# Patient Record
Sex: Male | Born: 1980 | Race: Black or African American | Hispanic: No
Health system: Midwestern US, Community
[De-identification: ages and names within clinical notes are randomized; demographics above are authoritative.]

## PROBLEM LIST (undated history)

## (undated) DIAGNOSIS — Q742 Other congenital malformations of lower limb(s), including pelvic girdle: Secondary | ICD-10-CM

## (undated) HISTORY — PX: APPENDECTOMY: SHX54

## (undated) LAB — EKG 12-LEAD: ECG Report: NORMAL

---

## 2008-12-29 LAB — STREP SCREEN GROUP A THROAT: Rapid Strep A Screen: NEGATIVE

## 2009-06-28 LAB — STREP SCREEN GROUP A THROAT: Rapid Strep A Screen: NEGATIVE

## 2009-09-19 LAB — STREP SCREEN GROUP A THROAT: Rapid Strep A Screen: NEGATIVE

## 2009-10-06 NOTE — ED Notes (Signed)
Pt brought back to room 13. On cardiac monitor as charted. No distress noted.    Eden Emms, RN  10/06/09 2102

## 2009-10-06 NOTE — Discharge Instructions (Signed)
Acute Bronchitis     Bronchitis is a problem of the air tubes leading to your lungs. Acute means the illness started quickly. In this condition, the lining of those tubes becomes puffy (swollen) and can leak fluid. This makes it harder for air to get in and out of your lungs. You may cough a lot. This is because the air tubes are narrow. Bronchitis is most often caused by a virus. Medicines that kill germs (antibiotics) may be needed with germ (bacteria) infections for people who:   Smoke.   Have lasting (chronic) lung problems.   Are elderly.     HOME CARE      Rest.   Drink enough water and fluids to keep the pee clear or pale yellow.   Only take medicine as told by your doctor.     Medicines may be prescribed that will open up the airways. This will help make breathing easier.   Bronchitis usually gets better on its own in a few days.     Recovery from some problems (symptoms) of bronchitis may be slow. You should start feeling a little better after 2 to 3 days. Coughing may last for 3 to 4 weeks.     GET HELP RIGHT AWAY IF:      You or your child has a temperature by mouth above 102 F (38.9 C), not controlled by medicine.   Chills or chest pain develops.   You or your child develops very bad shortness of breath.   There is bloody saliva mixed with mucus (sputum).   You or your child throws up (vomits) often, loses too much fluid (dehydration), feels faint, or has a very bad headache.   You or your child does not improve after 1 week of treatment.       MAKE SURE YOU:        Understand these instructions.     Will watch this condition.   Will get help right away if you or your child is not doing well or gets worse.     Document Released: 04/19/2009    [employer] Patient Information 2011 [employer].

## 2009-10-06 NOTE — ED Provider Notes (Signed)
HPI  10/06/2009, Time: 9:03 PM.       Todd Dean is a 29 y.o. male presenting to the ED for SOB, beginning yesterday.  The complaint has been constant, mild in severity, and worsened by nothing. Pt also complaints of productive cough. Pt states he has chest tightness from his cough. He says symptoms feel like when he had asthma when he was younger.  He was in the ER 2 weeks ago and diagnosed with bronchitis. Pt smokes a cigar a day.    Review of Systems   Constitutional: Negative for fever and chills.   Respiratory: Positive for cough, chest tightness (with cough) and shortness of breath.    Cardiovascular: Negative for chest pain.   Gastrointestinal: Negative for nausea, vomiting, abdominal pain and diarrhea.   Genitourinary: Negative for dysuria.   Skin: Negative for wound.   All other systems reviewed and are negative.        Physical Exam  Constitutional: He appears well-developed and well-nourished.  No acute distress.  Head: Normocephalic and atraumatic.   Eyes: Conjunctivae are normal. PERRL.  HENT: Mucous membranes are moist.  Neck: Supple.   Cardiovascular: Regular rate.  Regular rhythm.  Heart sounds normal.  Distal pulses intact.   Pulmonary/Chest: No respiratory distress. Expiratory wheezing.  Abdominal: Soft. There is no tenderness. No guarding or rebound.  Musculoskeletal: No edema.   Skin: Warm and dry.   Neurological: He is alert and oriented.        Procedures    MDM  --------------------------------------------- PAST HISTORY ---------------------------------------------  Past Medical History: has a past medical history of Asthma.    Past Surgical History: has no past surgical history on file.    Social History:  reports that he has been smoking cigars.  He does not have any smokeless tobacco history on file.  He reports that he does not currently drink alcohol or use illicit drugs.    Family History: family history is not on file.     The patient???s home medications have been  reviewed.    Allergies: Review of patient's allergies indicates no known allergies.    -------------------------------------------------- RESULTS -------------------------------------------------    LABS:  Results for orders placed during the hospital encounter of 10/06/09   CARDIAC ED PROFILE       Component Value Range    Sodium 141  132 - 146 (mmol/L)    Potassium 3.5  3.5 - 5.5 (mmol/L)    Chloride 103  99 - 109 (mmol/L)    CO2 26  20 - 31 (mmol/L)    Glucose 95  70 - 110 (mg/dL)    Bun 18  6 - 20 (mg/dL)    Creatinine 1.0  0.7 - 1.3 (mg/dL)    Calcium 9.3  8.6 - 10.5 (mg/dL)    Hemolysis NONE      WBC 8.9  4.5 - 11.5 (E9/L)    RBC 5.65  3.80 - 5.80 (E12/L)    HGB 14.3  12.5 - 16.5 (g/dL)    HCT 13.2  44.0 - 10.2 (%)    MCV 76.4 (*) 80.0 - 99.9 (fL)    MCH 25.4 (*) 26.0 - 35.0 (pg)    MCHC 33.2  32.0 - 34.5 (%)    RDW 14.4  11.5 - 15.0 (fL)    Platelets 167  130 - 450 (E9/L)    MPV 8.9  7.0 - 12.0 (fL)    Total CK 206  32 - 294 (U/L)    CK-MB  0.4  0.0 - 5.0 (ng/mL)    Troponin I <0.01  0.00 - 0.40 (ng/mL)   D-DIMER, QUANTITATIVE       Component Value Range    D-Dimer, Quant <250     GFR CALCULATED       Component Value Range    Gfr Calculated >60  >=60 (ml/mn/1.73)       RADIOLOGY:  Interpreted by Radiologist.  XR CHEST PA AND LATERAL    (Results Pending)       EKG:  This EKG is signed and interpreted by the EP.    Time: 9:05 PM  Rate: 71  Rhythm: Sinus  Interpretation: no acute changes  Comparison: None    ------------------------- NURSING NOTES AND VITALS REVIEWED ---------------------------   The nursing notes within the ED encounter and vital signs as below have been reviewed.   BP 134/77   Pulse 73   Temp(Src) 98.4 ??F (36.9 ??C) (Oral)   Resp 16   Ht 5\' 11"  (1.803 m)   Wt 185 lb (83.915 kg)   BMI 25.80 kg/m2  Oxygen Saturation Interpretation: Normal at 100%    The patient???s available past medical records and past encounters were reviewed.      ------------------------------------------ PROGRESS NOTES  ------------------------------------------         Re-Evaluations:   2200: Patient feeling better after duonebs.    Counseling:   The emergency provider has spoken with the patient and discussed today???s results, in addition to providing specific details for the plan of care and counseling regarding the diagnosis and prognosis.  Questions are answered at this time and they are agreeable with the plan.      --------------------------------- ADDITIONAL PROVIDER NOTES ---------------------------------         This patient's ED course included: a personal history and physicial eaxmination, re-evaluation prior to disposition and cardiac monitoring    This patient has remained hemodynamically stable during their ED course. SpO2 100% on room air since he has been in the ED.       --------------------------------- IMPRESSION AND DISPOSITION ---------------------------------    IMPRESSION  1. Bronchitis, not specified as acute or chronic    2. Bronchospasm        DISPOSITION  Disposition: Discharge to home  Patient condition is good    SCRIBE ATTESTATION  10/06/2009, 10:41 PM.    This note is prepared by Fransico Michael, acting as Scribe for [employer], DO.  Trevone Prestwood Conkle-Lagroux, DO:  The scribe's documentation has been prepared under my direction and personally reviewed by me in its entirety.  I confirm that the note above accurately reflects all work, treatment, procedures, and medical decision making performed by me.       Jazzmyne Rasnick Conkle-Lagroux, DO  10/06/09 2242

## 2009-10-07 ENCOUNTER — Inpatient Hospital Stay
Admit: 2009-10-07 | Discharge: 2009-10-07 | Disposition: A | Discharge status: Home / Self Care | Attending: Emergency Medicine

## 2009-10-07 LAB — CARDIAC ED PROFILE
BUN: 18 mg/dL (ref 6–20)
CK-MB: 0.4 ng/mL (ref 0.0–5.0)
CO2: 26 mmol/L (ref 20–31)
Calcium: 9.3 mg/dL (ref 8.6–10.5)
Chloride: 103 mmol/L (ref 99–109)
Creatinine: 1 mg/dL (ref 0.7–1.3)
Glucose: 95 mg/dL (ref 70–110)
HCT: 43.1 % (ref 37.0–54.0)
HGB: 14.3 g/dL (ref 12.5–16.5)
MCH: 25.4 pg — ABNORMAL LOW (ref 26.0–35.0)
MCHC: 33.2 % (ref 32.0–34.5)
MCV: 76.4 fL — ABNORMAL LOW (ref 80.0–99.9)
MPV: 8.9 fL (ref 7.0–12.0)
Platelets: 167 E9/L (ref 130–450)
Potassium: 3.5 mmol/L (ref 3.5–5.5)
RBC: 5.65 E12/L (ref 3.80–5.80)
RDW: 14.4 fL (ref 11.5–15.0)
Sodium: 141 mmol/L (ref 132–146)
Total CK: 206 U/L (ref 32–294)
Troponin I: <0.01 ng/mL (ref 0.00–0.40)
WBC: 8.9 E9/L (ref 4.5–11.5)

## 2009-10-07 LAB — D-DIMER, QUANTITATIVE: D-Dimer, Quant: <250 ng/mL

## 2009-10-07 LAB — GFR CALCULATED: Gfr Calculated: >60 mL/min/{1.73_m2} (ref >=60)

## 2009-10-07 MED ADMIN — ipratropium-albuterol (DUONEB) nebulizer solution 1 ampule: 1 | RESPIRATORY_TRACT | @ 01:00:00 | NDC 00487020101

## 2009-10-07 MED FILL — IPRATROPIUM-ALBUTEROL 0.5-2.5 (3) MG/3ML IN SOLN: RESPIRATORY_TRACT | Qty: 3

## 2009-11-06 ENCOUNTER — Inpatient Hospital Stay
Admit: 2009-11-06 | Discharge: 2009-11-06 | Disposition: A | Discharge status: Home / Self Care | Attending: Emergency Medicine

## 2009-11-06 NOTE — Discharge Instructions (Signed)
Dental Caries      Dental caries (tooth decay) is the most common of all oral diseases. It is particularly important to deal with it from your child's 1st to 12th year of life. In these years, the baby teeth erupt and are replaced by the permanent teeth. The permanent teeth, excluding the 3rd molars, erupt (come out) into their working position.     For early treatment of dental caries, your child should get their first dental checkup between one and one-half and two years of age. This is important before any extensive cavities are established.     Dental caries often appears as a white chalky area on the enamel. It later softens, and then the tooth structure breaks down. If not treated right away, it progresses towards the pulp. It will then require more extensive treatment to save the tooth.     PREVENTION   In children, dentistry is mainly used for the prevention of dental cavities.   Sealants can help with prevention of cavities. Sealants are composite resins applied onto surfaces of teeth at risk for decay. They smooth out the fissures and pits, and prevent food entrapment that causes decay.   Fluoride tablets may also be prescribed in the children over 61 years of age, if your drinking water is not fluoridated. The fluoride absorbed by the tooth enamel makes them less susceptible to caries.   Thorough daily cleaning with a toothbrush and dental floss is the best way to prevent cavities.   Regular visits with a dentist for check-ups and cleanings is also very important.     SEEK IMMEDIATE MEDICAL ATTENTION IF:   You develop a fever over 102 F (38 C).   You develop redness and swelling of your face, jaw, or neck.   You are unable to open your mouth.    You have severe pain uncontrolled by pain medicine.     Document Released: 10/15/2001  Document Re-Released: 04/19/2009  [facility] Patient Information 2011 [place].

## 2009-11-06 NOTE — ED Notes (Signed)
C/o r lower dental pain, no facial swelling noted    Marcelline Mates, RN  11/06/09 [phone]

## 2009-11-06 NOTE — ED Provider Notes (Addendum)
Patient is a 29 y.o. male presenting with tooth pain. The history is provided by the patient.   Dental PainThe primary symptoms include mouth pain and dental injury. Primary symptoms do not include oral bleeding, oral lesions, headaches, fever, shortness of breath, sore throat, angioedema or cough. The symptoms began 5 to 7 days ago. The symptoms are worsening. The symptoms occur constantly.   Additional symptoms include: dental sensitivity to temperature and gum tenderness. Additional symptoms do not include: gum swelling, purulent gums, trismus, jaw pain, facial swelling, trouble swallowing, pain with swallowing, excessive salivation, dry mouth, taste disturbance, smell disturbance, drooling, ear pain, hearing loss, nosebleeds, swollen glands, goiter and fatigue. Medical issues do not include: alcohol problem, smoking, chewing tobacco, immunosuppression, periodontal disease and cancer.       Review of Systems   Constitutional: Negative for fever, chills and fatigue.   HENT: Negative for hearing loss, ear pain, nosebleeds, sore throat, facial swelling, drooling, trouble swallowing and sinus pressure.    Eyes: Negative for pain, discharge and redness.   Respiratory: Negative for cough, shortness of breath and wheezing.    Cardiovascular: Negative for chest pain.   Gastrointestinal: Negative for nausea, vomiting, abdominal pain and diarrhea.   Genitourinary: Negative for dysuria and frequency.   Musculoskeletal: Negative for back pain and arthralgias.   Skin: Negative for rash and wound.   Neurological: Negative for weakness and headaches.   Hematological: Negative for adenopathy.   All other systems reviewed and are negative.        Physical Exam   Nursing note and vitals reviewed.  Constitutional: He is oriented to person, place, and time. He appears well-developed and well-nourished.   HENT:   Head: Normocephalic and atraumatic.   Mouth/Throat:       Eyes: Pupils are equal, round, and reactive to light.   Neck:  Normal range of motion. Neck supple.   Cardiovascular: Normal rate, regular rhythm and normal heart sounds.    No murmur heard.  Pulmonary/Chest: Effort normal and breath sounds normal.   Abdominal: Soft. Bowel sounds are normal. No tenderness. He has no rebound and no guarding.   Musculoskeletal: He exhibits no edema.   Neurological: He is alert and oriented to person, place, and time.   Skin: Skin is warm and dry.       Procedures    MDM    Labs      Radiology       EKG Interpretation      Ruthell Rummage, MD  11/06/09 [phone]    --------------------------------------------- PAST HISTORY ---------------------------------------------  Past Medical History: has a past medical history of Asthma.    Past Surgical History: has no past surgical history on file.    Social History:  reports that he has been smoking cigars.  He does not have any smokeless tobacco history on file.  He reports that he does not currently drink alcohol or use illicit drugs.    Family History: family history is not on file.     The patient???s home medications have been reviewed.    Allergies: Review of patient's allergies indicates no known allergies.    -------------------------------------------------- RESULTS -------------------------------------------------  Results for orders placed during the hospital encounter of 10/06/09   EKG 12-LEAD       Component Value Range    ECG Report Sinus rhythm.Normal ECGNO PREVIOUS TRACING      PHYSICIAN Physician Interpreter:  Janett Billow Conkle-LaGroux, DO      ECG Heart Rate ECG HEART RATE: 71 /  min      ECG RR Interval ECG RR INTERVAL: 845 ms      ECG P Duration ECG P DURATION: 110 ms      ECG QRS Duration ECG QRS DURATION: 84 ms      P-R Interval ECG PR INTERVAL: 136 ms      Q-T Interval ECG QT INTERVAL: 362 ms      ECG QTC Interval ECG QTC INTERVAL: 393 ms      ECG QT Dispersion ECG QT DISPERSION: 28 ms      P Axis ECG P AXIS: 48 deg      ECG QRS Axis ECG QRS AXIS: 36 deg      T Axis ECG T AXIS: 11 deg        CARDIAC ED PROFILE       Component Value Range    Sodium 141  132 - 146 (mmol/L)    Potassium 3.5  3.5 - 5.5 (mmol/L)    Chloride 103  99 - 109 (mmol/L)    CO2 26  20 - 31 (mmol/L)    Glucose 95  70 - 110 (mg/dL)    Bun 18  6 - 20 (mg/dL)    Creatinine 1.0  0.7 - 1.3 (mg/dL)    Calcium 9.3  8.6 - 10.5 (mg/dL)    Hemolysis NONE      WBC 8.9  4.5 - 11.5 (E9/L)    RBC 5.65  3.80 - 5.80 (E12/L)    HGB 14.3  12.5 - 16.5 (g/dL)    HCT 43.1  37.0 - 54.0 (%)    MCV 76.4 (*) 80.0 - 99.9 (fL)    MCH 25.4 (*) 26.0 - 35.0 (pg)    MCHC 33.2  32.0 - 34.5 (%)    RDW 14.4  11.5 - 15.0 (fL)    Platelets 167  130 - 450 (E9/L)    MPV 8.9  7.0 - 12.0 (fL)    Total CK 206  32 - 294 (U/L)    CK-MB 0.4  0.0 - 5.0 (ng/mL)    Troponin I <0.01  0.00 - 0.40 (ng/mL)   D-DIMER, QUANTITATIVE       Component Value Range    D-Dimer, Quant <250     GFR CALCULATED       Component Value Range    Gfr Calculated >60  >=60 (ml/mn/1.73)          ------------------------- NURSING NOTES AND VITALS REVIEWED ---------------------------   The nursing notes within the ED encounter and vital signs as below have been reviewed.   BP 144/74   Pulse 80   Temp(Src) 97.9 ??F (36.6 ??C) (Oral)   Resp 14   Ht 5' 11"$  (1.803 m)   Wt 185 lb (83.915 kg)   BMI 25.80 kg/m2  Oxygen Saturation Interpretation: Normal      ------------------------------------------ PROGRESS NOTES ------------------------------------------   I have spoken with the patient and discussed today???s results, in addition to providing specific details for the plan of care and counseling regarding the diagnosis and prognosis.  Their questions are answered at this time and they are agreeable with the plan.  --------------------------------------------- PAST HISTORY ---------------------------------------------  Past Medical History: has a past medical history of Asthma.    Past Surgical History: has no past surgical history on file.    Social History:  reports that he has been smoking cigars.  He does not  have any smokeless tobacco history on file.  He reports that he does not  currently drink alcohol or use illicit drugs.    Family History: family history is not on file.     The patient???s home medications have been reviewed.    Allergies: Review of patient's allergies indicates no known allergies.    -------------------------------------------------- RESULTS -------------------------------------------------  Results for orders placed during the hospital encounter of 10/06/09   EKG 12-LEAD       Component Value Range    ECG Report Sinus rhythm.Normal ECGNO PREVIOUS TRACING      PHYSICIAN Physician Interpreter:  Janett Billow Conkle-LaGroux, DO      ECG Heart Rate ECG HEART RATE: 71 /min      ECG RR Interval ECG RR INTERVAL: 845 ms      ECG P Duration ECG P DURATION: 110 ms      ECG QRS Duration ECG QRS DURATION: 84 ms      P-R Interval ECG PR INTERVAL: 136 ms      Q-T Interval ECG QT INTERVAL: 362 ms      ECG QTC Interval ECG QTC INTERVAL: 393 ms      ECG QT Dispersion ECG QT DISPERSION: 28 ms      P Axis ECG P AXIS: 48 deg      ECG QRS Axis ECG QRS AXIS: 36 deg      T Axis ECG T AXIS: 11 deg     CARDIAC ED PROFILE       Component Value Range    Sodium 141  132 - 146 (mmol/L)    Potassium 3.5  3.5 - 5.5 (mmol/L)    Chloride 103  99 - 109 (mmol/L)    CO2 26  20 - 31 (mmol/L)    Glucose 95  70 - 110 (mg/dL)    Bun 18  6 - 20 (mg/dL)    Creatinine 1.0  0.7 - 1.3 (mg/dL)    Calcium 9.3  8.6 - 10.5 (mg/dL)    Hemolysis NONE      WBC 8.9  4.5 - 11.5 (E9/L)    RBC 5.65  3.80 - 5.80 (E12/L)    HGB 14.3  12.5 - 16.5 (g/dL)    HCT 43.1  37.0 - 54.0 (%)    MCV 76.4 (*) 80.0 - 99.9 (fL)    MCH 25.4 (*) 26.0 - 35.0 (pg)    MCHC 33.2  32.0 - 34.5 (%)    RDW 14.4  11.5 - 15.0 (fL)    Platelets 167  130 - 450 (E9/L)    MPV 8.9  7.0 - 12.0 (fL)    Total CK 206  32 - 294 (U/L)    CK-MB 0.4  0.0 - 5.0 (ng/mL)    Troponin I <0.01  0.00 - 0.40 (ng/mL)   D-DIMER, QUANTITATIVE       Component Value Range    D-Dimer, Quant <250     GFR CALCULATED         Component Value Range    Gfr Calculated >60  >=60 (ml/mn/1.73)          ------------------------- NURSING NOTES AND VITALS REVIEWED ---------------------------   The nursing notes within the ED encounter and vital signs as below have been reviewed.   BP 144/74   Pulse 80   Temp(Src) 97.9 ??F (36.6 ??C) (Oral)   Resp 14   Ht 5' 11"$  (1.803 m)   Wt 185 lb (83.915 kg)   BMI 25.80 kg/m2  Oxygen Saturation Interpretation: Normal      ------------------------------------------ PROGRESS NOTES ------------------------------------------  I have spoken with the patient and discussed today???s results, in addition to providing specific details for the plan of care and counseling regarding the diagnosis and prognosis.  Their questions are answered at this time and they are agreeable with the plan.      --------------------------------- ADDITIONAL PROVIDER NOTES ---------------------------------          This patient is stable for discharge.  I have shared the specific conditions for return, as well as the importance of follow-up.        --------------------------------- ADDITIONAL PROVIDER NOTES ---------------------------------          This patient is stable for discharge.  I have shared the specific conditions for return, as well as the importance of follow-up.  P    Ruthell Rummage, MD  11/06/09 [phone]

## 2010-02-05 ENCOUNTER — Inpatient Hospital Stay: Admit: 2010-02-05 | Disposition: A | Discharge status: Home / Self Care | Source: Home / Self Care | Admitting: Surgery

## 2010-02-05 LAB — GFR CALCULATED: Gfr Calculated: >60 mL/min/{1.73_m2} (ref >=60)

## 2010-02-05 LAB — CBC WITH AUTO DIFFERENTIAL
Absolute Baso #: 0.06 E9/L (ref 0.00–0.20)
Absolute Eos #: 0.06 E9/L (ref 0.05–0.50)
Absolute Lymph #: 2.27 E9/L (ref 1.50–4.00)
Absolute Mono #: 0.83 E9/L (ref 0.10–0.95)
Absolute Neut #: 10.05 E9/L — ABNORMAL HIGH (ref 1.80–7.30)
Basophils: 1 % (ref 0–2)
Eosinophils %: 0 % (ref 0–6)
HCT: 43.1 % (ref 37.0–54.0)
HGB: 14.1 g/dL (ref 12.5–16.5)
Lymphocytes: 17 % — ABNORMAL LOW (ref 20–42)
MCH: 25.5 pg — ABNORMAL LOW (ref 26.0–35.0)
MCHC: 32.8 % (ref 32.0–34.5)
MCV: 77.9 fL — ABNORMAL LOW (ref 80.0–99.9)
MPV: 9 fL (ref 7.0–12.0)
Monocytes: 6 % (ref 2–12)
Platelets: 155 E9/L (ref 130–450)
RBC: 5.54 E12/L (ref 3.80–5.80)
RDW: 15.4 fL — ABNORMAL HIGH (ref 11.5–15.0)
Seg Neutrophils: 76 % (ref 43–80)
WBC: 13.3 E9/L — ABNORMAL HIGH (ref 4.5–11.5)

## 2010-02-05 LAB — AMYLASE: Amylase: 80 U/L (ref 30–118)

## 2010-02-05 LAB — COMPREHENSIVE METABOLIC PANEL
ALT: 26 U/L (ref 10–49)
AST: 29 U/L (ref 0–33)
Albumin: 4.8 g/dL (ref 3.2–4.8)
Alkaline Phosphatase: 64 U/L (ref 45–129)
BUN: 25 mg/dL — ABNORMAL HIGH (ref 6–20)
Bilirubin, Total: 0.2 mg/dL — ABNORMAL LOW (ref 0.3–1.2)
CO2: 29 mmol/L (ref 20–31)
Calcium: 9.6 mg/dL (ref 8.6–10.5)
Chloride: 103 mmol/L (ref 99–109)
Creatinine: 1 mg/dL (ref 0.7–1.3)
Glucose: 95 mg/dL (ref 70–110)
Potassium: 4 mmol/L (ref 3.5–5.5)
Sodium: 141 mmol/L (ref 132–146)
Total Protein: 7.5 g/dL (ref 5.7–8.2)

## 2010-02-05 LAB — APTT: PTT: 26.8 secs. (ref 23.3–32.4)

## 2010-02-05 LAB — PROTIME-INR
INR: 0.9
Protime: 10.9 secs. (ref 9.8–12.5)

## 2010-02-05 LAB — LIPASE: Lipase: 21 U/L (ref 6–51)

## 2010-02-05 LAB — ANISOCYTOSIS

## 2010-02-05 MED ORDER — IOVERSOL 68 % IV SOLN
68 % | Freq: Once | INTRAVENOUS | Status: AC
Start: 2010-02-05 — End: 2010-02-05
  Administered 2010-02-05: 16:00:00 via INTRAVENOUS

## 2010-02-05 MED ORDER — ONDANSETRON HCL 4 MG/2ML IJ SOLN
4 MG/2ML | Freq: Once | INTRAMUSCULAR | Status: AC
Start: 2010-02-05 — End: 2010-02-05
  Administered 2010-02-05: 15:00:00 4 mg via INTRAVENOUS

## 2010-02-05 MED ORDER — SODIUM CHLORIDE 0.9 % IV BOLUS
0.9 % | Freq: Once | INTRAVENOUS | Status: AC
Start: 2010-02-05 — End: 2010-02-05
  Administered 2010-02-05: 15:00:00 1000 mL via INTRAVENOUS

## 2010-02-05 MED ORDER — ERTAPENEM SODIUM 1 G IJ SOLR
1 GM | Freq: Once | INTRAMUSCULAR | Status: AC
Start: 2010-02-05 — End: 2010-02-05
  Administered 2010-02-05: 17:00:00 via INTRAVENOUS

## 2010-02-05 MED ORDER — KETOROLAC TROMETHAMINE 30 MG/ML IJ SOLN
30 MG/ML | Freq: Once | INTRAMUSCULAR | Status: AC
Start: 2010-02-05 — End: 2010-02-05
  Administered 2010-02-05: 15:00:00 30 mg via INTRAVENOUS

## 2010-02-05 MED ORDER — PNEUMOCOCCAL 23-POLYVALENT VACC INJ
25 | Freq: Once | INTRAMUSCULAR | Status: DC
Start: 2010-02-05 — End: 2010-02-06

## 2010-02-05 MED ADMIN — dextrose 5 % and 0.45 % NaCl with KCl 20 mEq infusion: INTRAVENOUS | @ 20:00:00 | NDC 00338067104

## 2010-02-05 MED ADMIN — hydrocodone-acetaminophen (VICODIN) 5-500 MG per tablet 2 tablet: 2 | ORAL | @ 22:00:00 | NDC 00406035762

## 2010-02-05 MED FILL — SODIUM CHLORIDE 0.9 % IV SOLN: 0.9 % | INTRAVENOUS | Qty: 1000

## 2010-02-05 MED FILL — ONDANSETRON HCL 4 MG/2ML IJ SOLN: 4 MG/2ML | INTRAMUSCULAR | Qty: 2

## 2010-02-05 MED FILL — KCL IN DEXTROSE-NACL 20-5-0.45 MEQ/L-%-% IV SOLN: INTRAVENOUS | Qty: 1000

## 2010-02-05 MED FILL — KETOROLAC TROMETHAMINE 30 MG/ML IJ SOLN: 30 MG/ML | INTRAMUSCULAR | Qty: 1

## 2010-02-05 MED FILL — HYDROCODONE-ACETAMINOPHEN 5-500 MG PO TABS: 5-500 MG | ORAL | Qty: 2

## 2010-02-05 MED FILL — INVANZ 1 G IJ SOLR: 1 g | INTRAMUSCULAR | Qty: 1000

## 2010-02-05 MED FILL — BUPIVACAINE HCL (PF) 0.25 % IJ SOLN: 0.25 % | INTRAMUSCULAR | Qty: 10

## 2010-02-05 NOTE — ED Notes (Signed)
Patient resting in bed.  Pain reported as being better - discomfort present.  Nausea resolved.  V/s/s.  Continue to monitor.    Milford Cage Teyton Pattillo, RN  02/05/10 [phone]

## 2010-02-05 NOTE — ED Notes (Signed)
Surgical consent signed.  V/s/s.  Patient resting in bed.  Continue to monitor.    Milford Cage Kathyjo Briere, RN  02/05/10 [phone]

## 2010-02-05 NOTE — Op Note (Signed)
Todd Dean, Todd Dean                                   [id number]      DATE OF PROCEDURE:  02/05/2010      SURGEON:  Willadean Carol, M.D.      ASSISTANT:      PREOPERATIVE DIAGNOSIS:  Acute appendicitis.      POSTOPERATIVE DIAGNOSIS:  Acute appendicitis.      OPERATION:  Laparoscopic appendectomy.      ANESTHESIA:  General.      ESTIMATED BLOOD LOSS:  Minimal.      COMPLICATIONS:  None.      FLUIDS:  Per Anesthesia.      SPECIMEN:  Appendix.      DISPOSITION:  The patient was stable when sent to recovery with plans for   discharge home in the morning, after a _____ -hour observation admission.      HISTORY AND INDICATIONS FOR PROCEDURE:  This is a 29 year old male with acute   onset of abdominal pain, worse in the right lower quadrant.  The exam was   consistent with appendicitis.  CT findings suggested an appendicolith and   acute appendicitis.  He was explained the risks, benefits, alternatives,   expected outcomes and was willing to proceed with the surgery.      DETAILS OF THE PROCEDURE:  With the patient in the supine position, after   induction of anesthesia, a Foley catheter was placed.  The skin was prepped   and draped in an aseptic fashion.      Towel clamps were applied to the infraumbilical plane.  A 5-mm incision was   made.  A Veress needle was placed into the peritoneal cavity and a saline   flush was used to confirm placement.  The opening pressure on entering the   abdomen was 2 mmHg.  The intraabdominal pressure was then raised to 15.   Following this, a 5-mm retractable bladed trocar was placed into the   peritoneal cavity.  A laparoscope was inserted.  There was no purulent fluid.   However, there was some inflammation noted in the right lower quadrant.  In   order to see, I placed the patient in a steep Trendelenburg position, and   placed a 5-mm port site in the suprapubic region above the bladder cuff.   Using some gentle retraction, I identified the appendix.  It was retrocecal,   and it was  thickened and inflamed.  At this point, a 12-mm left lower   quadrant port was placed to triangulate the appendix.  It was grasped and   retracted upward with a Babcock clamp.  Using sharp scissors, I dissected it   free from the retroperitoneum, removing any loose adhesions, and exposing the   entire appendix.  I created a window at the base of the appendix near its   junction to the cecum with a Art gallery manager.  I passed an endoscopic GIA   stapler with a blue load and transected the appendix from the base of the   cecum.  Following this, the LigaSure was used to secure the mesoappendix   along its entirety.  The appendix was placed into an EndoPouch and pulled   through the 12-mm port site.  Next, the right lower quadrant was irrigated   with saline and suctioned dry until the return of fluid was clear.  The rest  of the abdominal cavity was inspected with no acute intraabdominal   pathology.      All needle, sponge, and instrument counts were correct.  All ports were   removed under direct vision with the laparoscope.  The pneumoperitoneum was   completely evacuated.  The 12-mm port site fascial defect was reapproximated   with a stitch of #0 Vicryl.  The skin incisions were closed with 4-0   Monocryl, followed by Steri-Strips.      The patient tolerated the procedure well.  He was transferred to recovery in   stable condition.      The plan is for routine postoperative care and discharge in the morning.      Dictated by:  Willadean Carol, M.D.            Todd Dean   DD: 02/05/2010    1:59 P    DT: 02/05/2010  2:16 P   [id number]    [id number]   CC:  MICHAEL J. Margarito Liner., M.D.        Willadean Carol, M.D.

## 2010-02-05 NOTE — Brief Op Note (Signed)
Brief Postoperative Note    Todd Dean  Date of Birth:  February 03, 1981  [id number]    Pre-operative Diagnosis: acute appendicitis    Post-operative Diagnosis: Same    Procedure: laparoscopic appendectomy    Anesthesia: [facility]    Surgeons/Assistants: Elyse Jarvis    Estimated Blood Loss: Minimal    Complications: none    Specimens: were obtained      Todd Dean

## 2010-02-05 NOTE — ED Provider Notes (Signed)
HPI  02/05/2010, Time: 0925.       Todd Dean is a 29 y.o. male presenting to the ED for abdominal pain, beginning 7 hours ago.  The complaint has been constant, moderate in severity, and worsened by nothing.  Pt states that he has been having right sided abdominal pain, nausea, vomiting and diarrhea.     Review of Systems   Constitutional: Negative for fever, chills and diaphoresis.   HENT: Negative for neck pain and neck stiffness.    Respiratory: Negative for cough and shortness of breath.    Cardiovascular: Negative for chest pain.   Gastrointestinal: Positive for nausea, vomiting, abdominal pain and diarrhea.   Genitourinary: Negative.    Musculoskeletal: Negative.    Skin: Negative.    Neurological: Negative for weakness, numbness and headaches.   All other systems reviewed and are negative.        Physical Exam  Constitutional: He appears well-developed and well-nourished.  No acute distress.  Head: Normocephalic and atraumatic.   Eyes: Conjunctivae are normal. PERRL.  HENT: Mucous membranes are moist.  Neck: Supple.   Cardiovascular: Regular rate.  Regular rhythm.  Heart sounds normal.  Distal pulses intact.  Pulmonary/Chest: No respiratory distress. Breath sounds normal.  Abdominal: Soft. There is diffuse tenderness. No guarding or rebound.  Musculoskeletal: No edema.   Skin: Warm and dry.   Neurological: He is alert and oriented.      Procedures    MDM    --------------------------------------------- PAST HISTORY ---------------------------------------------  Past Medical History: has a past medical history of Asthma.    Past Surgical History: has no past surgical history on file.    Social History:  reports that he has been smoking cigars.  He does not have any smokeless tobacco history on file.  He reports that he drinks alcohol.  He reports that he does not currently use illicit drugs.    Family History: family history is not on file.     The patient???s home medications have been  reviewed.    Allergies: Review of patient's allergies indicates no known allergies.    -------------------------------------------------- RESULTS -------------------------------------------------    LABS:  Results for orders placed during the hospital encounter of 02/05/10   CBC WITH AUTO DIFFERENTIAL       Component Value Range    WBC 13.3 (*) 4.5 - 11.5 (E9/L)    RBC 5.54  3.80 - 5.80 (E12/L)    HGB 14.1  12.5 - 16.5 (g/dL)    HCT 16.1  09.6 - 04.5 (%)    MCV 77.9 (*) 80.0 - 99.9 (fL)    MCH 25.5 (*) 26.0 - 35.0 (pg)    MCHC 32.8  32.0 - 34.5 (%)    RDW 15.4 (*) 11.5 - 15.0 (fL)    Platelets 155  130 - 450 (E9/L)    MPV 9.0  7.0 - 12.0 (fL)    Absolute Neut # 10.05 (*) 1.80 - 7.30 (E9/L)    Absolute Lymph # 2.27  1.50 - 4.00 (E9/L)    Absolute Mono # 0.83  0.10 - 0.95 (E9/L)    Absolute Eos # 0.06  0.05 - 0.50 (E9/L)    Absolute Baso # 0.06  0.00 - 0.20 (E9/L)    Seg Neutrophils 76  43 - 80 (%)    Lymphocytes 17 (*) 20 - 42 (%)    Monocytes 6  2 - 12 (%)    Eosinophils 0  0 - 6 (%)    Basophils  1  0 - 2 (%)   COMPREHENSIVE METABOLIC PANEL       Component Value Range    Sodium 141  132 - 146 (mmol/L)    Potassium 4.0  3.5 - 5.5 (mmol/L)    Chloride 103  99 - 109 (mmol/L)    CO2 29  20 - 31 (mmol/L)    Glucose 95  70 - 110 (mg/dL)    Bun 25 (*) 6 - 20 (mg/dL)    Creatinine 1.0  0.7 - 1.3 (mg/dL)    Calcium 9.6  8.6 - 10.5 (mg/dL)    Total Protein 7.5  5.7 - 8.2 (g/dL)    Albumin 4.8  3.2 - 4.8 (g/dL)    Alkaline Phosphatase 64  45 - 129 (U/L)    AST 29  0 - 33 (U/L)    Bilirubin, Total 0.2 (*) 0.3 - 1.2 (mg/dL)    ALT 26  10 - 49 (U/L)    Hemolysis NONE     PROTIME-INR       Component Value Range    Protime 10.9  9.8 - 12.5 (secs.)    INR 0.9     APTT       Component Value Range    PTT 26.8  23.3 - 32.4 (secs.)   AMYLASE       Component Value Range    Amylase 80  30 - 118 (U/L)   LIPASE       Component Value Range    Lipase 21  6 - 51 (U/L)   GFR CALCULATED       Component Value Range    Gfr Calculated >60  >=60  (ml/mn/1.73)   ANISOCYTOSIS       Component Value Range    Anisocytosis 1+         RADIOLOGY:  Interpreted by Radiologist.  CT ABDOMEN PELVIS WITH IV CONTRAST ONLY    Final Result:     Narrative: ""  History- Abdominal pain and vomiting    CT scanning of the abdomen and pelvis was performed from above the  diaphragm through the symphysis pubis with IV contrast only. No films  are available for comparison.    The study demonstrates a dilated enhancing appendix. There is a  prominent appendicolith in the proximal appendix and this is consistent  with a early appendicitis. Very little inflammation is seen around the  appendix however.    Visualized portions of the lungs and mediastinum are normal.    The liver enhances normally with no focal or diffuse abnormality. The  gallbladder is normal. There are no splenic or pancreatic  abnormalities. The adrenal glands are of normal size and there are no  renal abnormalities.    There are no retroperitoneal abnormalities.    No mass or fluid is seen in the pelvis.    The small bowel is normal. The colon contains a large amount of gas and  stool.    Impression- The study shows an appendicolith in the proximal appendix.  The distal appendix is mildly dilated and shows some enhancement. This  may represent an early appendicitis and clinical correlation is  recommended. These findings were discussed with Dr. Rosalia Hammers.    Transcriptionist- PRR M.D.  Read ByWhitney Post M.D.  Released ByWhitney Post M.D.  Released Date Time- 02/05/10 1131         ------------------------- NURSING NOTES AND VITALS REVIEWED ---------------------------  The nursing notes within the ED encounter and vital  signs as below have been reviewed.   BP 103/57   Pulse 80   Temp(Src) 98.3 ??F (36.8 ??C) (Oral)   Resp 12   Ht 5\' 11"  (1.803 m)   Wt 175 lb (79.379 kg)   BMI 24.41 kg/m2   SpO2 99%  Oxygen Saturation Interpretation: Normal    The patient???s available past medical records and past encounters were reviewed.       ------------------------------------------ PROGRESS NOTES ------------------------------------------     Consultations:  Time: 1135.   Spoke with Dr. Elyse Jarvis (Surgery).  Discussed case.  They will provide consultation and take the patient to surgery.     Re-Evaluations:  Time: 1130  Pt now has focal RLQ tenderness. Given invanz IV      Counseling:   The emergency provider has spoken with the patient and discussed today???s results, in addition to providing specific details for the plan of care and counseling regarding the diagnosis and prognosis.  Questions are answered at this time and they are agreeable with the plan.      --------------------------------- ADDITIONAL PROVIDER NOTES ---------------------------------     This patient's ED course included: a personal history and physicial eaxmination, re-evaluation prior to disposition, cardiac monitoring and continuous pulse oximetry    This patient has remained hemodynamically stable during their ED course.     --------------------------------- IMPRESSION AND DISPOSITION ---------------------------------    IMPRESSION  No diagnosis found.    DISPOSITION  Disposition: Admit to operating room  Patient condition is good    SCRIBE ATTESTATION  02/05/2010, 11:32 AM.    This note is prepared by Johnette Abraham, acting as Scribe for Mercer Pod, MD.    Mercer Pod, MD:  The scribe's documentation has been prepared under my direction and personally reviewed by me in its entirety.  I confirm that the note above accurately reflects all work, treatment, procedures, and medical decision making performed by me.                   Nuala Alpha, MD  02/05/10 1140

## 2010-02-05 NOTE — Anesthesia Pre-Procedure Evaluation (Signed)
Todd Dean     Anesthesia Evaluation     Patient summary reviewed and Nursing notes reviewed    Airway   Mallampati: II  TM distance: >3 FB  Neck ROM: full  Dental      Pulmonary - normal exam   (+) asthma,   ROS comment: Daily Cigars  Cardiovascular - negative ROS and normal exam  Exercise tolerance: good    Neuro/Psych - negative ROS   GI/Hepatic/Renal      Comments: Nausea/Vomiting/Diarrhea  Recent alcohol consumption    Endo/Other - negative ROS   Abdominal   Abdomen: rigid and tender.                  Allergies: Review of patient's allergies indicates no known allergies.    NPO Status:                            Past Medical History   Diagnosis Date   ??? Asthma    History reviewed. No pertinent past surgical history.  History   Substance Use Topics   ??? Smoking status: Current Some Day Smoker     Types: Cigars   ??? Smokeless tobacco: Not on file   ??? Alcohol Use: Yes      social - 2x / month     History   Drug Use No     Home Meds: albuterol     Inpatient Meds:        Ht Readings from Last 1 Encounters:   02/05/10 5\' 11"  (1.803 m)     Wt Readings from Last 1 Encounters:   02/05/10 175 lb (79.379 kg)      Body mass index is 24.41 kg/(m^2).    Filed Vitals:    02/05/10 1159   BP: 112/59   Pulse: 85   Temp: 98.5 ??F (36.9 ??C)   Resp: 14     Lab Results   Component Value Date    WBC 13.3* 02/05/2010    HGB 14.1 02/05/2010    HCT 43.1 02/05/2010    PLT 155 02/05/2010     Lab Results   Component Value Date    NA 141 02/05/2010    K 4.0 02/05/2010    CL 103 02/05/2010    CO2 29 02/05/2010    BUN 25* 02/05/2010    CREATININE 1.0 02/05/2010    CALCIUM 9.6 02/05/2010     Lab Results   Component Value Date    INR 0.9 02/05/2010    PROTIME 10.9 02/05/2010      Anesthesia Plan    ASA 2 - Emergent   (Plan general anesthesia via ETT and a rapid sequence intubation.  )  intravenous induction   Anesthetic plan and risks discussed with patient.    Plan discussed with CRNA.          Todd Dean  02/05/2010

## 2010-02-05 NOTE — ED Notes (Signed)
Patient verbalizes onset of emesis / diarrhea / abdominal pain starting this morning at 0200.  Patient indicates that he consumed alcohol - 2 glasses of vodka.  States that pain feels like when has had food poisoning in the past.  Pain in RLQ - pain on palpation across lower aspect of abdomen - no pain in epigastric area - minor pain on palpation in umbilicus area.  Patient verbalizes nausea.  Continue to monitor.    Milford Cage Hidaya Daniel, RN  02/05/10 [phone]

## 2010-02-05 NOTE — Progress Notes (Signed)
Report called to [facility] RN, transfer request placed, patient in stable condition.

## 2010-02-05 NOTE — H&P (Signed)
.Department of General Surgery - Adult  Surgical Service general  Attending Pre-operative History and Physical      DIAGNOSIS:  Acute appendicitis    INDICATION:  appendicitis    PROCEDURE:  Lap appendectomy    CHIEF COMPLAINT:  abd pain    History Obtained From:  patient    HISTORY OF PRESENT ILLNESS:    The patient is a 29 y.o. male with significant past medical history of nothing who presents with abd pain for 24 hours now worse in the RLQ.  He has no nausea or vomiting, no diarrhea, but has decreased appetite, he has never had sx like this before.    Past Medical History:    nothing  Past Surgical History:    nothing  Medications Prior to Admission:   none    Allergies:  Review of patient's allergies indicates no known allergies.  History of allergic reaction to anesthesia:  No    Social History:   TOBACCO:  Never used tobacco  ETOH:  Never drank alcohol  DRUGS:  Never used recreational drugs  Family History:   History reviewed. No pertinent family history.  REVIEW OF SYSTEMS:    CONSTITUTIONAL:  negative  EYES:  negative  HEENT:  negative  RESPIRATORY:  negative  CARDIOVASCULAR:  negative  GENITOURINARY:  negative  INTEGUMENT/BREAST:  negative  HEMATOLOGIC/LYMPHATIC:  negative  ALLERGIC/IMMUNOLOGIC:  negative  ENDOCRINE:  negative  MUSCULOSKELETAL:  negative  NEUROLOGICAL:  negative    PHYSICAL EXAM:      BP 112/59   Pulse 85   Temp(Src) 98.5 ??F (36.9 ??C) (Oral)   Resp 14   Ht 5\' 11"  (1.803 m)   Wt 175 lb (79.379 kg)   BMI 24.41 kg/m2   SpO2 99% I      Eyes:  Lids and lashes normal, pupils equal, round and reactive to light, extra ocular muscles intact, sclera clear, conjunctiva normal    Head/ENT:  Normocephalic, without obvious abnormality, atramatic, sinuses nontender on palpation, external ears without lesions, oral pharynx with moist mucus membranes, tonsils without erythema or exudates, gums normal and good dentition.    Neck:  Supple, symmetrical, trachea midline, no adenopathy, thyroid symmetric, not  enlarged and no tenderness, skin normal    Heart:  Normal apical impulse, regular rate and rhythm, normal S1 and S2, no S3 or S4, and no murmur noted    Lungs:  No increased work of breathing, good air exchange, clear to auscultation bilaterally, no crackles or wheezing    Abdomen:  Soft, tender RLQ at mcburney's point with rebound c/w appendicitis    Extremities:  No clubbing, cyanosis, or edema    As above    DATA:  CBC with Differential:    Lab Results   Component Value Date    WBC 13.3 02/05/2010    RBC 5.54 02/05/2010    HGB 14.1 02/05/2010    HCT 43.1 02/05/2010    PLT 155 02/05/2010    MCV 77.9 02/05/2010    MCH 25.5 02/05/2010    MCHC 32.8 02/05/2010    RDW 15.4 02/05/2010    SEGSPCT 76 02/05/2010    LYMPHOPCT 17 02/05/2010    MONOPCT 6 02/05/2010    EOSPCT 0 02/05/2010    BASOPCT 1 02/05/2010    MONOSABS 0.83 02/05/2010    LYMPHSABS 2.27 02/05/2010    EOSABS 0.06 02/05/2010    BASOSABS 0.06 02/05/2010       ASSESSMENT AND PLAN:    1.  Patient is  a 29 y.o. male with above specified procedure planned laparoscopic appendectomy with anesthesia  2.  Procedure options, risks and benefits reviewed with patient.  Patient expresses understanding.

## 2010-02-05 NOTE — Anesthesia Post-Procedure Evaluation (Signed)
Anesthesia Post-op Note    Patient: Todd Dean    Post-op assessment:    Vital Signs:  Stable   Pulmonary/Airway Status:  Stable   Cardiovascular/Hydration Status:  Stable   Level of Consciousness:  Stable   Pain Controlled:  Yes   Complications Noted: None   Additional follow-up/ Treatment: None    Filed Vitals:    02/06/10 0501   BP: 126/60   Pulse: 64   Temp: 98.4 ??F (36.9 ??C)   Resp: 16         Malachy Mood, MD  7:53 AM  02/06/2010

## 2010-02-06 LAB — CBC WITH AUTO DIFFERENTIAL
Absolute Baso #: 0.09 E9/L (ref 0.00–0.20)
Absolute Eos #: 0 E9/L — ABNORMAL LOW (ref 0.05–0.50)
Absolute Lymph #: 1.31 E9/L — ABNORMAL LOW (ref 1.50–4.00)
Absolute Mono #: 0.7 E9/L (ref 0.10–0.95)
Absolute Neut #: 12.41 E9/L — ABNORMAL HIGH (ref 1.80–7.30)
Basophils: 1 % (ref 0–2)
Eosinophils %: 0 % (ref 0–6)
HCT: 37.8 % (ref 37.0–54.0)
HGB: 12.7 g/dL (ref 12.5–16.5)
Lymphocytes: 9 % — ABNORMAL LOW (ref 20–42)
MCH: 25.9 pg — ABNORMAL LOW (ref 26.0–35.0)
MCHC: 33.6 % (ref 32.0–34.5)
MCV: 77.2 fL — ABNORMAL LOW (ref 80.0–99.9)
MPV: 9.1 fL (ref 7.0–12.0)
Monocytes: 5 % (ref 2–12)
Platelet Estimate: NORMAL
Platelets: 139 E9/L (ref 130–450)
RBC: 4.9 E12/L (ref 3.80–5.80)
RDW: 15.6 fL — ABNORMAL HIGH (ref 11.5–15.0)
Seg Neutrophils: 86 % — ABNORMAL HIGH (ref 43–80)
WBC: 14.5 E9/L — ABNORMAL HIGH (ref 4.5–11.5)

## 2010-02-06 LAB — ANISOCYTOSIS

## 2010-02-06 MED ORDER — OXYCODONE-ACETAMINOPHEN 5-325 MG PO TABS
5-325 MG | ORAL_TABLET | ORAL | Status: AC | PRN
Start: 2010-02-06 — End: 2010-02-13

## 2010-02-06 MED ADMIN — sodium chloride (PF) 0.9 % injection: @ 01:00:00 | NDC 58016499501

## 2010-02-06 MED ADMIN — hydrocodone-acetaminophen (VICODIN) 5-500 MG per tablet 2 tablet: 2 | ORAL | @ 04:00:00 | NDC 00406035762

## 2010-02-06 MED ADMIN — hydrocodone-acetaminophen (VICODIN) 5-500 MG per tablet 2 tablet: 2 | ORAL | @ 11:00:00 | NDC 00406035762

## 2010-02-06 MED ADMIN — oxycodone-acetaminophen (PERCOCET) 5-325 MG per tablet 2 tablet: 2 | ORAL | @ 17:00:00 | NDC 68084035511

## 2010-02-06 MED ADMIN — HYDROmorphone (DILAUDID) injection 1 mg: 1 mg | INTRAVENOUS | @ 01:00:00 | NDC 00409131230

## 2010-02-06 MED FILL — SODIUM CHLORIDE 0.9 % IJ SOLN: 0.9 % | INTRAMUSCULAR | Qty: 10

## 2010-02-06 MED FILL — HYDROMORPHONE HCL 2 MG/ML IJ SOLN: 2 MG/ML | INTRAMUSCULAR | Qty: 1

## 2010-02-06 MED FILL — HYDROCODONE-ACETAMINOPHEN 5-500 MG PO TABS: 5-500 MG | ORAL | Qty: 2

## 2010-02-06 MED FILL — OXYCODONE-ACETAMINOPHEN 5-325 MG PO TABS: 5-325 MG | ORAL | Qty: 2

## 2010-02-06 MED FILL — FENTANYL CITRATE 0.05 MG/ML IJ SOLN: 0.05 MG/ML | INTRAMUSCULAR | Qty: 5

## 2010-02-06 MED FILL — FLUZONE PRESERVATIVE FREE IM SUSP: INTRAMUSCULAR | Qty: 0.5

## 2010-02-06 MED FILL — PNEUMOVAX 23 25 MCG/0.5ML IJ INJ: 25 MCG/0.5ML | INTRAMUSCULAR | Qty: 0.5

## 2010-02-06 NOTE — Plan of Care (Signed)
Problem: PAIN MANAGEMENT  Goal: Ability to express needs and understand communication  Outcome: Ongoing  Pt will communicate presence of pain.

## 2010-02-06 NOTE — Discharge Instructions (Signed)
No heavy lifting or strenuous activity.  Patient needs to be off work for at least 1 week and then light duty for an additional week.  Ok to shower in 2 days  Follow up with Dr. Elyse Jarvis in 2 weeks at [phone] and call for an appt

## 2010-02-06 NOTE — Progress Notes (Signed)
Doing well, pain improvedc stable for discharge to home

## 2010-02-09 MED FILL — DIPRIVAN 10 MG/ML IV EMUL: 10 MG/ML | INTRAVENOUS | Qty: 20

## 2010-02-15 NOTE — Discharge Summary (Signed)
HARM, JOU                                         [id number]      ADMITTED:  02/05/2010                       DISCHARGED:  02/06/2010      ADMISSION DIAGNOSIS:  Appendicitis.      INDICATIONS FOR ADMISSION:  This is a pleasant, 30 year old male with a   history of abdominal pain, found to have appendicitis and taken to surgery   for laparoscopic appendectomy.      HOSPITAL COURSE:  On February 05, 2010, the patient underwent an   uncomplicated laparoscopic appendectomy and was transferred to the   postoperative unit uneventfully.  His diet was advanced as tolerated.  He had   no postoperative complications.  He was discharged home the following day in   stable condition.            Dictated by:  Willadean Carol, M.D.            Ninetta Lights Janace Hoard   DD: 02/15/2010    6:36 A    DT: 02/15/2010  7:59 A   [id number]    [phone]   CC:  Willadean Carol, M.D.

## 2010-07-06 ENCOUNTER — Inpatient Hospital Stay: Admit: 2010-07-06 | Discharge: 2010-07-06 | Disposition: A | Discharge status: Home / Self Care

## 2010-07-06 MED ORDER — DICLOFENAC SODIUM 75 MG PO TBEC
75 MG | ORAL_TABLET | Freq: Two times a day (BID) | ORAL | Status: DC
Start: 2010-07-06 — End: 2011-01-03

## 2010-07-06 NOTE — Discharge Instructions (Signed)
Groin Strain  (Adductor Strain, Groin)  A groin pull refers to strained muscles on the upper inner part of the thigh. This usually occurs in muscles several minutes to hours after an athletic event. It is more likely to occur when your muscles are not warmed up well enough or if your conditioning is not good enough. A pulled muscle, or muscle strain, occurs when a muscle is over stretched and some muscle fibers are torn. This is especially common in athletes where a sudden violent force placed on a muscle pushes it past its ability to respond. Usually, recovery from a pulled muscle takes 1 to 2 weeks, but complete healing will take 5 to 6 weeks. Because there are millions of muscle fibers, following the injury, your body usually rapidly returns to normal.  HOME CARE INSTRUCTIONS   Apply ice to the sore muscle for 15 to 20 minutes each hour while awake for the first 2 days. Put ice in a plastic bag and place a towel between the bag of ice and your skin.    Do not use the pulled muscle for several days, or if having pain.    You may wrap the injured area with an elastic bandage for comfort. Be careful not to bind it too tightly because this may interfere with blood circulation.    Only take over-the-counter or prescription medicines for pain, discomfort, or fever as directed by your caregiver. Do not use aspirin as this may increase bleeding (bruising) at injury site.    Warming up before exercise and developing proper conditioning helps prevent muscle strains.   SEEK IMMEDIATE MEDICAL CARE IF:   There is increased pain or swelling in the affected area.  MAKE SURE YOU:    Understand these instructions.    Will watch your condition.    Will get help right away if you are not doing well or get worse.   Document Released: 02/12/2007 Document Re-Released: 04/21/2008  [facility] Patient Information 2012 [place].

## 2010-07-06 NOTE — ED Notes (Signed)
Pt denies any n/v    Linde Gillis, RN  07/06/10 [phone]

## 2010-07-06 NOTE — ED Provider Notes (Signed)
HPI  07/06/2010, Time: 11:41 AM.       Todd Dean is a 30 y.o. male presenting to the ED for abdominal pain, beginning PTA.  The complaint has been constant, mild in severity, and worsened by nothing.  Patient states that he was at work, bent down to lift a box and felt a sudden pain in the left lower abdomen. Denies any nausea or vomiting.     Review of Systems   Constitutional: Negative for fever.   HENT: Negative for congestion.    Eyes: Negative for visual disturbance.   Respiratory: Negative for shortness of breath.    Cardiovascular: Negative for chest pain.   Gastrointestinal: Positive for abdominal pain. Negative for nausea and vomiting.   Genitourinary: Negative for hematuria.   Musculoskeletal: Negative for joint swelling.   Skin: Negative for rash.   Neurological: Negative for headaches.   Hematological: Does not bruise/bleed easily.   Psychiatric/Behavioral: Negative for hallucinations.       Physical Exam  Constitutional: He appears well-developed and well-nourished.  No acute distress.  Head: Normocephalic and atraumatic.   Eyes: Conjunctivae are normal. PERRL.  HENT: Mucous membranes are moist.  Neck: Supple.   Cardiovascular: Regular rate.  Regular rhythm.  Heart sounds normal.   Pulmonary/Chest: No respiratory distress. Breath sounds normal.  Abdominal: Soft. There is LLQ tenderness, worse when he gets up or coughs or stretches. No guarding or rebound. No hernia. No reducible mass.   Musculoskeletal: No edema.   Skin: Warm and dry.   Neurological: He is alert and oriented.      Procedures    MDM        --------------------------------------------- PAST HISTORY ---------------------------------------------  Past Medical History:  has a past medical history of Asthma and Bronchitis.    Past Surgical History:  has past surgical history that includes Appendectomy and Wisdom tooth extraction.    Social History:  reports that he has been smoking Cigars.  He has never used smokeless tobacco. He  reports that he drinks alcohol. He reports that he does not use illicit drugs.    Family History: family history is not on file.     The patient???s home medications have been reviewed.    Allergies: Review of patient's allergies indicates no known allergies.    -------------------------------------------------- RESULTS -------------------------------------------------    LABS:  No results found for this visit on 07/06/10.    RADIOLOGY:  Interpreted by Radiologist.  XR ABD SERIES W/ CHEST 1 V    Final Result:          X-ray Abd Series With Chest 1 V    07/06/2010  "" Chest one view  Indications- Pain  Comparisons are not available Single view of the chest shows the lung fields to be clear .  No pleural fluid is seen.  The cardiovascular silhouette appears normal . The aorta is tortuous and ectatic likely secondary to atherosclerotic disease...  IMPRESSION-  Tortuous ectatic aorta  Abdomen 3 views, flat supine and upright  Indications- Pain  The bowel gas pattern is nonspecific. There is no evidence for obstruction.  There is no evidence for ileus.  There is no evidence to suggest ascites or mass.  Impression- Nonspecific bowel gas pattern.        TranscriptionistJuluis Mire   M.D.      Read ByRhea Belton   M.D.      Released ByRhea Belton   M.D.      Released Date Time-  07/06/10 1224  ------------------------------------------------------------------------------     ------------------------- NURSING NOTES AND VITALS REVIEWED ---------------------------   The nursing notes within the ED encounter and vital signs as below have been reviewed.   BP 132/80   Pulse 86   Temp(Src) 98.3 ??F (36.8 ??C) (Oral)   Resp 16   Ht 5' 11"$  (1.803 m)   Wt 178 lb (80.74 kg)   BMI 24.83 kg/m2   SpO2 99%  Oxygen Saturation Interpretation: Normal      ------------------------------------------ PROGRESS NOTES ------------------------------------------      The emergency provider has spoken with the significant other and patient and  discussed today???s results, in addition to providing specific details for the plan of care and counseling regarding the diagnosis and prognosis.  Questions are answered at this time and they are agreeable with the plan.    --------------------------------- ADDITIONAL PROVIDER NOTES ---------------------------------          This patient is stable for discharge.  The emergency provider has shared the specific conditions for return, as well as the importance of follow-up.        --------------------------------- IMPRESSION AND DISPOSITION ---------------------------------    IMPRESSION  1. Abdominal wall strain        DISPOSITION  Disposition: Discharge to home  Patient condition is good    SCRIBE ATTESTATION  07/06/2010, 3:51 PM.    This note is prepared by Marina Gravel, acting as Scribe for Alfredia Ferguson, MD    Alfredia Ferguson, MD:  The scribe's documentation has been prepared under my direction and personally reviewed by me in its entirety.  I confirm that the note above accurately reflects all work, treatment, procedures, and medical decision making performed by me.           Milinda Hirschfeld, MD  07/06/10 [phone]

## 2011-01-03 NOTE — Discharge Instructions (Signed)
Pain of Unknown Etiology  (Pain Without a Known Cause)  You have come to your caregiver because of pain. Pain can occur in any part of the body. Often there is not a definite cause. If your laboratory (blood or urine) work was normal and x-rays or other studies were normal, your caregiver may treat you without knowing the cause of the pain. An example of this is the headache. Most headaches are diagnosed by taking a history. This means your caregiver asks you questions about your headaches. Your caregiver determines a treatment based on your answers. Usually testing done for headaches is normal. Often testing is not done unless there is no response to medications. Regardless of where your pain is located today, you can be given medications to make you comfortable. If no physical cause of pain can be found, most cases of pain will gradually leave as suddenly as they came.   If you have a painful condition and no reason can be found for the pain, It is important that you follow up with your caregiver. If the pain becomes worse or does not go away, it may be necessary to repeat tests and look further for a possible cause.   Only take over-the-counter or prescription medicines for pain, discomfort, or fever as directed by your caregiver.    For the protection of your privacy, test results can not be given over the phone. Make sure you receive the results of your test. Ask as to how these results are to be obtained if you have not been informed. It is your responsibility to obtain your test results.    You may continue all activities unless the activities cause more pain. When the pain lessens, it is important to gradually resume normal activities. Resume activities by beginning slowly and gradually increasing the intensity and duration of the activities or exercise. During periods of severe pain, bed-rest may be helpful. Lay or sit in any position that is comfortable.    Ice used for acute (sudden) conditions may be  effective. Use a large plastic bag filled with ice and wrapped in a towel. This may provide pain relief.    See your caregiver for continued problems. They can help or refer you for exercises or physical therapy if necessary.   If you were given medications for your condition, do not drive, operate machinery or power tools, or sign legal documents for 24 hours. Do not drink alcohol, take sleeping pills, or take other medications that may interfere with treatment.  See your caregiver immediately if you have pain that is becoming worse and not relieved by medications.  Document Released: 10/18/2000 Document Re-Released: 04/21/2008  [facility] Patient Information 2012 [place].

## 2011-01-03 NOTE — ED Provider Notes (Addendum)
30 y.o. male presents to the ED for evaluation of right great toe pain.  The patient states that 3-4 weeks ago.  The patient states progressively getting worse.   The patient denies trauma or injury to the affected area.   The patient states he does have an appointment on Friday with "foot doctor at 815 am."    Review of Systems: Unless otherwise stated in this report or unable to obtain because of the patient's clinical or mental status as evidenced by the medical record, the patient's positive and negative responses for review of systems for constitutional, eyes, ENT, cardiovascular, respiratory, gastrointestinal, neurological, GU, musculoskeletal, and integument systems and related systems to the presenting problem are either stated in the HPI or were not pertinent or were negative for the symptoms and/or complaints related to the presenting medical problem.    Past Medical History:  has a past medical history of Asthma and Bronchitis.    Past Surgical History:  has past surgical history that includes Appendectomy and Wisdom tooth extraction.    Social History:  reports that he has been smoking Cigars.  He has never used smokeless tobacco. He reports that he drinks alcohol. He reports that he does not use illicit drugs.    Family History: family history is not on file.     Medications:    Previous Medications    No medications on file       Allergies: Review of patient's allergies indicates no known allergies.      Physical Exam:  BP 136/82   Pulse 88   Temp(Src) 98.3 ??F (36.8 ??C) (Oral)   Ht 5\' 11"  (1.803 m)   Wt 174 lb (78.926 kg)   BMI 24.27 kg/m2   SpO2 97%  Nurses note reviewed and patient is not hypoxic.    General:  The patient appears well and in no apparent distress.  Patient is resting comfortably on cart.  Skin:  Warm, dry, no pallor noted.  Head:  Normocephalic, atraumatic  Eye:  Normal conjunctiva  Ears, Nose, Mouth, and Throat:  oral mucosa is moist  Respiratory:  Patient is in no  distress  Musculoskeletal:  The patient has no obvious deformity noted to the right lower extremity. The patient does have discoloration noted to the right great toe. The patient states no significant pain to palpation. The patient is able flex and extend the right great toe. The patient has no warmth or erythema. Patient has no pain to the dorsum of the foot. The patient has no pain to the medial or lateral malleoli. The patient pulses are intact at dorsalis pedis and posterior tibialis. The patient has no pain with calcaneal squeeze.  Neurological:  A&O x4, normal sensory and motor.  Psychiatric:  Cooperative    Counseled:  Patient, Family, Regarding diagnosis, Regarding diagnostic results, Regarding treatment plan, Patient indicated understanding of instructions.      Progress Note:  The patient likely has fungal infection of the right great toe. The patient is to follow-up with podiatry. The patient will be started on any medications at this time.  The patient will be treated with adequate analgesia. The patient will be discharged home with similar medication. The patient is to follow-up with podiatry as scheduled on Friday at 8:15. The patient is to return if any signs or symptoms worsen. The patient agrees with the following plan the patient will be discharged to home.    Clinical Impression:  1. Toe pain    The patient  was seen and evaluated by Dr. Verdie Drown, including history, physical, medical decision making and final disposition.    Condition:  Stable    Disposition:  discharged    Follow-up:  The patient is to follow up with primary care physician in the next 2-3 days and to call for appointment tomorrow.  The patient will return to the ED if not able to do this or if symptoms worsen.  The patient agrees and has no other questions at this time.      Marolyn Haller, [place]  01/03/11 2258  ATTENDING PROVIDER ATTESTATION:     I have personally performed and/or participated in the history, exam, medical  decision making, and procedures and agree with all pertinent clinical information.      I have also reviewed and agree with the past medical, family and social history unless otherwise noted.      Percell Boston, MD  01/23/11 [phone]

## 2011-01-04 ENCOUNTER — Inpatient Hospital Stay: Admit: 2011-01-04 | Discharge: 2011-01-04 | Disposition: A | Discharge status: Home / Self Care

## 2011-01-04 MED ORDER — HYDROCODONE-ACETAMINOPHEN 5-500 MG PO TABS
5-500 MG | ORAL_TABLET | ORAL | Status: AC
Start: 2011-01-04 — End: 2011-01-10

## 2011-08-18 ENCOUNTER — Inpatient Hospital Stay
Admit: 2011-08-18 | Discharge: 2011-08-18 | Disposition: A | Discharge status: Home / Self Care | Attending: Emergency Medicine

## 2011-08-18 MED ORDER — CLINDAMYCIN HCL 150 MG PO CAPS
150 MG | ORAL_CAPSULE | ORAL | Status: AC
Start: 2011-08-18 — End: 2011-08-28

## 2011-08-18 MED ORDER — HYDROCODONE-ACETAMINOPHEN 5-325 MG PO TABS
5-325 MG | ORAL_TABLET | Freq: Four times a day (QID) | ORAL | Status: AC | PRN
Start: 2011-08-18 — End: 2011-08-23

## 2011-08-18 MED ORDER — GENTAMICIN SULFATE 0.3 % OP SOLN
0.3 % | OPHTHALMIC | Status: AC
Start: 2011-08-18 — End: 2011-08-28

## 2011-08-18 NOTE — ED Notes (Signed)
Discharged with voiced understanding of instructions - scripts given - left in no distress and without problem or complaint    Rudell Cobb, RN  08/18/11 0214

## 2011-08-18 NOTE — ED Provider Notes (Signed)
HPI:  08/18/2011,   Time: 1:53 AM         Todd Dean is a 31 y.o. male presenting to the ED for eye pain and toothache, beginning 2 days ago.  The complaint has been intermittent, moderate in severity, and worsened by chewing.  Patient states a gradual onset of some left eye pain and minimal rubbing the eye became more painful he describes redness to the left eye he does not wear glasses or contacts. No fever no chills. No nasal congestion no ear pain. Patient states he's also had right lower tooth pain for one week. He is [facility] in 10 days    ROS:   Unless otherwise stated in this report or unable to obtain because of the patient's clinical or mental status as evidenced by the medical record, this patients's positive and negative responses for Review of Systems, constitutional, psych, eyes, ENT, cardiovascular, respiratory, gastrointestinal, neurological, genitourinary, musculoskeletal, integument systems and systems related to the presenting problem are either stated in the preceding or were not pertinent or were negative for the symptoms and/or complaints related to the medical problem.      --------------------------------------------- PAST HISTORY ---------------------------------------------  Past Medical History:  has a past medical history of Asthma and Bronchitis.    Past Surgical History:  has past surgical history that includes Appendectomy and Wisdom tooth extraction.    Social History:  reports that he has been smoking Cigars.  He has never used smokeless tobacco. He reports that  drinks alcohol. He reports that he does not use illicit drugs.    Family History: family history is not on file.     The patient's home medications have been reviewed.    Allergies: Penicillins    -------------------------------------------------- RESULTS -------------------------------------------------  All laboratory and radiology results have been personally reviewed by myself   LABS:  No  results found for this visit on 08/18/11.    RADIOLOGY:  Interpreted by Radiologist.       ------------------------- NURSING NOTES AND VITALS REVIEWED ---------------------------   The nursing notes within the ED encounter and vital signs as below have been reviewed.   BP 118/78  Pulse 91  Temp(Src) 98.1 F (36.7 C) (Oral)  Resp 14  Ht 5\' 11"  (1.803 m)  Wt 175 lb (79.379 kg)  BMI 24.42 kg/m2  SpO2 100%  Oxygen Saturation Interpretation: Normal      ---------------------------------------------------PHYSICAL EXAM--------------------------------------      Constitutional/General: Alert and oriented x3, well appearing, non toxic in NAD  Head: NC/AT  Eyes: PERRL, EOMI, erythema left conjunctiva. Fluorescein exam positive for left corneal abrasion at 12:00 no foreign bodies noted under either eyelid. Slit-lamp exam normal except for corneal abrasion left eye.  Mouth: Oropharynx clear, handling secretions, no trismus. Tooth #32 is cracked and tender.   Neck: Supple, full ROM, no meningeal signs  Pulmonary: Lungs clear to auscultation bilaterally, no wheezes, rales, or rhonchi. Not in respiratory distress  Cardiovascular:  Regular rate and rhythm, no murmurs, gallops, or rubs. 2+ distal pulses  Abdomen: Soft, non tender, non distended,   Extremities: Moves all extremities x 4. Warm and well perfused  Skin: warm and dry without rash  Neurologic: GCS 15,  Psych: Normal Affect      ------------------------------ ED COURSE/MEDICAL DECISION MAKING----------------------  Medications - No data to display      Medical Decision Making:        Counseling:   The emergency provider has spoken with the patient and discussed  today's results, in addition to providing specific details for the plan of care and counseling regarding the diagnosis and prognosis.  Questions are answered at this time and they are agreeable with the plan.      --------------------------------- IMPRESSION AND DISPOSITION  ---------------------------------    IMPRESSION  1. Conjunctivitis    2. Toothache    3. Corneal abrasion        DISPOSITION  Disposition: Discharge to home  Patient condition is good                  Nuala Alpha, MD  08/18/11 [phone]

## 2011-08-18 NOTE — Discharge Instructions (Signed)
Dental Pain  A tooth ache may be caused by cavities (tooth decay). Cavities expose the nerve of the tooth to air and hot or cold temperatures. It may come from an infection or abscess (also called a boil or furuncle) around your tooth. It is also often caused by dental caries (tooth decay). This causes the pain you are having.  DIAGNOSIS   Your caregiver can diagnose this problem by exam.  TREATMENT    If caused by an infection, it may be treated with medications which kill germs (antibiotics) and pain medications as prescribed by your caregiver. Take medications as directed.   Only take over-the-counter or prescription medicines for pain, discomfort, or fever as directed by your caregiver.   Whether the tooth ache today is caused by infection or dental disease, you should see your dentist as soon as possible for further care.  SEEK MEDICAL CARE IF:  The exam and treatment you received today has been provided on an emergency basis only. This is not a substitute for complete medical or dental care. If your problem worsens or new problems (symptoms) appear, and you are unable to meet with your dentist, call or return to this location.  SEEK IMMEDIATE MEDICAL CARE IF:    You have a fever.   You develop redness and swelling of your face, jaw, or neck.   You are unable to open your mouth.   You have severe pain uncontrolled by pain medicine.  MAKE SURE YOU:    Understand these instructions.   Will watch your condition.   Will get help right away if you are not doing well or get worse.  Document Released: 01/23/2005 Document Revised: 01/12/2011 Document Reviewed: 09/11/2007  [facility] Patient Information 2012 [place].    Conjunctivitis  Conjunctivitis is commonly called "pink eye." Conjunctivitis can be caused by bacterial or viral infection, allergies, or injuries. There is usually redness of the lining of the eye, itching, discomfort, and sometimes discharge. There may be deposits of matter along the  eyelids. A viral infection usually causes a watery discharge, while a bacterial infection causes a yellowish, thick discharge. Pink eye is very contagious and spreads by direct contact.  You may be given antibiotic eyedrops as part of your treatment. Before using your eye medicine, remove all drainage from the eye by washing gently with warm water and cotton balls. Continue to use the medication until you have awakened 2 mornings in a row without discharge from the eye. Do not rub your eye. This increases the irritation and helps spread infection. Use separate towels from other household members. Wash your hands with soap and water before and after touching your eyes. Use cold compresses to reduce pain and sunglasses to relieve irritation from light. Do not wear contact lenses or wear eye makeup until the infection is gone.  SEEK MEDICAL CARE IF:    Your symptoms are not better after 3 days of treatment.   You have increased pain or trouble seeing.   The outer eyelids become very red or swollen.  Document Released: 03/02/2004 Document Revised: 01/12/2011 Document Reviewed: 01/23/2005  [facility] Patient Information 2012 [place].  Corneal Abrasion  The cornea is the clear covering at the front and center of the eye. When looking at the colored portion (iris) of the eye, you are looking through that person's cornea.   This very thin tissue is made up of many layers. The surface layer is a single layer of cells called the corneal epithelium. This  is one of the most sensitive tissues in the body. If a scratch or injury causes the corneal epithelium to come off, it is called a corneal abrasion. If the injury extends to the tissues below the epithelium, the condition is called a corneal ulcer.   CAUSES    Scratches.   Trauma.   Foreign body in the eye.   Some people have recurrences of abrasions in the area of the original injury even after they heal. This is called recurrent erosion syndrome. Recurrent  erosion syndromes generally improve and go away with time.  SYMPTOMS    Eye pain.   Difficulty or inability to keep the injured eye open.   The eye becomes very sensitive to light.   Recurrent erosions tend to happen suddenly, first thing in the morning - usually upon awakening and opening the eyes.  DIAGNOSIS   Your eye professional can diagnose a corneal abrasion during an eye exam. Dye is usually placed in the eye using a drop or a small paper strip moistened by the patient's tears. When the eye is examined with a special light, the abrasion shows up clearly because of the dye.  TREATMENT    Small abrasions may be treated with antibiotic drops or ointment alone.   Usually a pressure patch is specially applied. Pressure patches prevent the eye from blinking, allowing the corneal epithelium to heal. Because blinking is less, a pressure patch also reduces the amount of pain present in the eye during healing. Most corneal abrasions heal within 2-3 days with no effect on vision. WARNING: Do not drive or operate machinery while your eye is patched. Your ability to judge distances is impaired.   If abrasion becomes infected and spreads to the deeper tissues of the cornea, a corneal ulcer can result. This is serious because it can cause corneal scarring. Corneal scars interfere with light passing through the cornea, and cause a loss of vision in the involved eye.   If your caregiver has given you a follow-up appointment, it is very important to keep that appointment. Not keeping the appointment could result in a severe eye infection or permanent loss of vision. If there is any problem keeping the appointment, you must call back to this facility for assistance.  SEEK MEDICAL CARE IF:    You have pain, light sensitivity and a scratchy feeling in one eye (or both).   Your pressure patch keeps loosening up and you can blink your eye under the patch after treatment.   Any kind of discharge develops from the  involved eye after treatment or if the lids stick together in the morning.   You have the same symptoms in the morning as you did with the original abrasion days, weeks or months after the abrasion healed.  MAKE SURE YOU:    Understand these instructions.   Will watch your condition.   Will get help right away if you are not doing well or get worse.  Document Released: 01/21/2000 Document Revised: 01/12/2011 Document Reviewed: 08/29/2007  [facility] Patient Information 2012 [place].

## 2012-05-01 ENCOUNTER — Inpatient Hospital Stay
Admit: 2012-05-01 | Discharge: 2012-05-01 | Disposition: A | Discharge status: Home / Self Care | Attending: Family Medicine

## 2012-05-01 MED ORDER — IBUPROFEN 800 MG PO TABS
800 MG | ORAL_TABLET | Freq: Four times a day (QID) | ORAL | Status: DC | PRN
Start: 2012-05-01 — End: 2012-05-01

## 2012-05-01 MED ORDER — HYDROCODONE-ACETAMINOPHEN 5-325 MG PO TABS
5-325 MG | ORAL_TABLET | Freq: Four times a day (QID) | ORAL | Status: AC | PRN
Start: 2012-05-01 — End: 2012-05-08

## 2012-05-01 MED ORDER — NAPROXEN 500 MG PO TABS
500 MG | ORAL_TABLET | Freq: Two times a day (BID) | ORAL | Status: DC
Start: 2012-05-01 — End: 2013-09-22

## 2012-05-01 NOTE — ED Notes (Signed)
Warm blanket and coffee given. No new c/o. Awaiting disposition.    Sylvie FarrierJennifer W Sameen Leas, RN  05/01/12 [phone]
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
## 2012-11-04 ENCOUNTER — Encounter

## 2013-06-08 ENCOUNTER — Inpatient Hospital Stay
Admit: 2013-06-08 | Disposition: A | Discharge status: Home / Self Care | Source: Home / Self Care | Admitting: Internal Medicine

## 2013-06-08 LAB — SERUM DRUG SCREEN
Acetaminophen Level: <15 ug/mL (ref 10.0–30.0)
Ethanol Lvl: <10 mg/dL
Salicylate, Serum: <0.3 mg/dL (ref 0.0–30.0)
TCA Scrn: NEGATIVE ng/mL

## 2013-06-08 LAB — CK-MB: CK-MB: <1 ng/mL (ref 0.0–7.7)

## 2013-06-08 LAB — CBC WITH AUTO DIFFERENTIAL
Basophils %: 0 % (ref 0–2)
Basophils Absolute: 0 E9/L (ref 0.00–0.20)
Eosinophils %: 2 % (ref 0–6)
Eosinophils Absolute: 0.32 E9/L (ref 0.05–0.50)
Hematocrit: 38.8 % (ref 37.0–54.0)
Hemoglobin: 12.7 g/dL (ref 12.5–16.5)
Lymphocytes %: 19 % — ABNORMAL LOW (ref 20–42)
Lymphocytes Absolute: 3 E9/L (ref 1.50–4.00)
MCH: 25.5 pg — ABNORMAL LOW (ref 26.0–35.0)
MCHC: 32.8 % (ref 32.0–34.5)
MCV: 77.7 fL — ABNORMAL LOW (ref 80.0–99.9)
MPV: 8.7 fL (ref 7.0–12.0)
Monocytes %: 7 % (ref 2–12)
Monocytes Absolute: 1.11 E9/L — ABNORMAL HIGH (ref 0.10–0.95)
Neutrophils %: 72 % (ref 43–80)
Neutrophils Absolute: 11.38 E9/L — ABNORMAL HIGH (ref 1.80–7.30)
PLATELET SLIDE REVIEW: NORMAL
Platelets: 172 E9/L (ref 130–450)
RBC: 4.99 E12/L (ref 3.80–5.80)
RDW: 15.1 fL — ABNORMAL HIGH (ref 11.5–15.0)
WBC: 15.8 E9/L — ABNORMAL HIGH (ref 4.5–11.5)

## 2013-06-08 LAB — BASIC METABOLIC PANEL
Anion Gap: 12 mmol/L (ref 7–16)
BUN: 17 mg/dL (ref 6–20)
CO2: 28 mmol/L (ref 22–29)
Calcium: 9.4 mg/dL (ref 8.6–10.2)
Chloride: 102 mmol/L (ref 98–107)
Creatinine: 1.1 mg/dL (ref 0.7–1.2)
GFR African American: >60
GFR Non-African American: >60 mL/min/{1.73_m2} (ref >=60)
Glucose: 111 mg/dL — ABNORMAL HIGH (ref 74–109)
Potassium: 4.6 mmol/L (ref 3.5–5.0)
Sodium: 142 mmol/L (ref 132–146)

## 2013-06-08 LAB — URINE DRUG SCREEN
Amphetamine Screen, Urine: NOT DETECTED (ref <1000)
Barbiturate Screen, Ur: NOT DETECTED (ref <200)
Benzodiazepine Screen, Urine: NOT DETECTED (ref <200)
Cannabinoid Scrn, Ur: NOT DETECTED
Cocaine Metabolite Screen, Urine: NOT DETECTED (ref <300)
Methadone Screen, Urine: NOT DETECTED (ref <300)
Opiate Scrn, Ur: POSITIVE — AB
PCP Screen, Urine: NOT DETECTED (ref <25)
Propoxyphene Scrn, Ur: NOT DETECTED (ref <300)

## 2013-06-08 LAB — TROPONIN: Troponin: <0.01 ng/mL (ref 0.00–0.03)

## 2013-06-08 LAB — D-DIMER, QUANTITATIVE: D-Dimer, Quant: 249 ng/mL DDU

## 2013-06-08 LAB — AMYLASE: Amylase: 69 U/L (ref 20–100)

## 2013-06-08 LAB — CK: Total CK: 222 U/L — ABNORMAL HIGH (ref 20–200)

## 2013-06-08 LAB — LIPASE: Lipase: 27 U/L (ref 13–60)

## 2013-06-08 MED ORDER — ACETAMINOPHEN 325 MG PO TABS
325 | ORAL | Status: DC | PRN
Start: 2013-06-08 — End: 2013-06-09

## 2013-06-08 MED ORDER — MORPHINE SULFATE (PF) 4 MG/ML IV SOLN
4 MG/ML | Freq: Once | INTRAVENOUS | Status: AC
Start: 2013-06-08 — End: 2013-06-08
  Administered 2013-06-08: 10:00:00 4 mg via INTRAVENOUS

## 2013-06-08 MED ORDER — BUDESONIDE 0.5 MG/2ML IN SUSP
0.5 MG/2ML | Freq: Two times a day (BID) | RESPIRATORY_TRACT | Status: DC
Start: 2013-06-08 — End: 2013-06-09
  Administered 2013-06-09 (×2): 500 mg via RESPIRATORY_TRACT

## 2013-06-08 MED ORDER — ONDANSETRON HCL 4 MG/2ML IJ SOLN
4 MG/2ML | Freq: Once | INTRAMUSCULAR | Status: AC
Start: 2013-06-08 — End: 2013-06-08
  Administered 2013-06-08: 10:00:00 4 mg via INTRAVENOUS

## 2013-06-08 MED ORDER — SODIUM CHLORIDE 0.9 % IV BOLUS
0.9 % | Freq: Once | INTRAVENOUS | Status: AC
Start: 2013-06-08 — End: 2013-06-08
  Administered 2013-06-08: 10:00:00 1000 mL via INTRAVENOUS

## 2013-06-08 MED ORDER — OXYCODONE-ACETAMINOPHEN 5-325 MG PO TABS
5-325 | ORAL | Status: DC | PRN
Start: 2013-06-08 — End: 2013-06-08
  Administered 2013-06-08 (×2): 1 via ORAL

## 2013-06-08 MED ADMIN — HYDROmorphone HCl PF (DILAUDID) injection 1 mg: 1 mg | INTRAVENOUS | @ 12:00:00 | NDC 00409128331

## 2013-06-08 MED ADMIN — xenon (Xe-133) gas 10 milli Curie: 10 | RESPIRATORY_TRACT | @ 14:00:00

## 2013-06-08 MED ADMIN — technetium albumin aggregated (MAA) solution 6 milli Curie: 6 | INTRAVENOUS | @ 14:00:00

## 2013-06-08 MED ADMIN — ketorolac (TORADOL) injection 30 mg: 30 mg | INTRAVENOUS | @ 13:00:00 | NDC 00409379501

## 2013-06-08 MED ADMIN — levofloxacin (LEVAQUIN) 750 MG/150ML infusion 750 mg: 750 mg | INTRAVENOUS | @ 15:00:00 | NDC 25021013283

## 2013-06-08 MED ADMIN — ipratropium-albuterol (DUONEB) nebulizer solution 1 ampule: 1 | RESPIRATORY_TRACT | @ 21:00:00 | NDC 00487020101

## 2013-06-08 MED ADMIN — ioversol (OPTIRAY) 68 % injection 100 mL: 100 mL | INTRAVENOUS | @ 12:00:00 | NDC 00019132311

## 2013-06-08 MED ADMIN — aspirin chewable tablet 324 mg: 324 mg | ORAL | @ 15:00:00 | NDC 63739043401

## 2013-06-08 MED FILL — LOVENOX 40 MG/0.4ML SC SOLN: 40 MG/0.4ML | SUBCUTANEOUS | Qty: 0.4

## 2013-06-08 MED FILL — HYDROMORPHONE HCL PF 1 MG/ML IJ SOLN: 1 MG/ML | INTRAMUSCULAR | Qty: 1

## 2013-06-08 MED FILL — MORPHINE SULFATE (PF) 4 MG/ML IV SOLN: 4 mg/mL | INTRAVENOUS | Qty: 1

## 2013-06-08 MED FILL — OXYCODONE-ACETAMINOPHEN 5-325 MG PO TABS: 5-325 MG | ORAL | Qty: 1

## 2013-06-08 MED FILL — IPRATROPIUM-ALBUTEROL 0.5-2.5 (3) MG/3ML IN SOLN: RESPIRATORY_TRACT | Qty: 3

## 2013-06-08 MED FILL — LEVOFLOXACIN IN D5W 750 MG/150ML IV SOLN: 750 MG/150ML | INTRAVENOUS | Qty: 150

## 2013-06-08 MED FILL — ASPIRIN LOW STRENGTH 81 MG PO CHEW: 81 MG | ORAL | Qty: 4

## 2013-06-08 MED FILL — ONDANSETRON HCL 4 MG/2ML IJ SOLN: 4 MG/2ML | INTRAMUSCULAR | Qty: 2

## 2013-06-08 MED FILL — BUDESONIDE 0.5 MG/2ML IN SUSP: 0.5 MG/2ML | RESPIRATORY_TRACT | Qty: 2

## 2013-06-08 MED FILL — KETOROLAC TROMETHAMINE 30 MG/ML IJ SOLN: 30 MG/ML | INTRAMUSCULAR | Qty: 1

## 2013-06-08 NOTE — ED Notes (Addendum)
Patient signed out to me pending labs and CT of chest.    On reevaluation, patient still is having pleuritic pain, but it is improving. He denies any IV drug use.  I ordered Toradol after CT results.   Patient does have nonspecific abnormal EKG. There are some inferior changes from previous EKG which was in 2011.    Patient states he currently has no primary physician. I discussed with Dr. Woody Sellerayudu. With the patient's elevated white blood cell count, and pleuritic chest pain, and atelectasis noted on the CT scan, she would like him to be treated with Levaquin for pneumonia. She is willing to admit.    Milagro Belmares Conkle-Lagroux, DO  06/08/13 1108    Jamyla Ard Conkle-Lagroux, DO  06/08/13 1111

## 2013-06-08 NOTE — Progress Notes (Signed)
Monitor [id number] sent to ED

## 2013-06-08 NOTE — ED Notes (Signed)
Pt in nuclear medicine.     Baldo Asharole A Karalyn Kadel, RN  06/08/13 1025

## 2013-06-08 NOTE — ED Notes (Signed)
Pt c/o pain in middle of back and straight thru to chest. States it feels like a spasm and is worse with inspiration. Dilaudid given. Pt ready for CT    Baldo AshCarole A Lydon Vansickle, RN  06/08/13 [phone]

## 2013-06-08 NOTE — Progress Notes (Signed)
Rounded on patient, and new admission folder given. Pt. guide reviewed and explanation of condition H, Patient Rights and Responsibility with patient & family member present.  Patient & family member  notified of room safe and discharge survey. Expressed no other needs or concerns at this time. Todd BaasS. Aija Scarfo UHA/US2/MT  06/08/2013 1345pm

## 2013-06-08 NOTE — H&P (Signed)
[facility] History and Physical      CHIEF COMPLAINT:  Chest and Back Pain    History of Present Illness:  Todd Dean is a 33 y.o male who presented to the ED with complaints of chest and back pain that started on Friday and the last few days has progressively gotten worse. Patient states that he has some SOB d/t excruciating pain with deep breath 10/10. Patient is a 2-3 a day Cigar smoker. Denies any alcohol or illicit drug use. Patient denies any recent injury to chest or back area.     Informant(s) for H&P:Provided by patient     REVIEW OF SYSTEMS:  Constitutional: negative for anorexia, chills, fatigue, fevers and sweats  Eyes: negative for icterus, irritation and redness  Ears, nose, mouth, throat, and face: negative for ear drainage, earaches, facial trauma, hearing loss, hoarseness, nasal congestion and sore throat  Respiratory: positive for pleurisy/chest pain and shortness of breath, negative for asthma, chronic bronchitis, cough, dyspnea on exertion, emphysema, pneumonia and wheezing  Cardiovascular: positive for chest pain and chest pressure/discomfort, negative for dyspnea, exertional chest pressure/discomfort, fatigue, lower extremity edema and palpitations  Gastrointestinal: positive for constipation, negative for abdominal pain, change in bowel habits, diarrhea, nausea and vomiting  Musculoskeletal:positive for back pain, negative for bone pain, muscle weakness, neck pain and stiff joints  Neurological: negative for coordination problems, dizziness, gait problems, headaches, memory problems, seizures and speech problems  Behavioral/Psych: positive for tobacco use, negative for excessive alcohol consumption and illegal drug usage    ZOX:[id number]:reviewed PMH,PSH,SOCIAL, HISTORY ,social history, family history reviewed: January 2 ofE by me .  Past Medical History   Diagnosis Date   ??? Asthma    ??? Bronchitis        Surgical History:  Past Surgical History   Procedure Laterality Date   ??? Appendectomy      ??? Wisdom tooth extraction         Medications Prior to Admission:    Prior to Admission medications    Medication Sig Start Date End Date Taking? Authorizing Provider   traMADol (ULTRAM) 50 MG tablet Take 50 mg by mouth every 6 hours as needed for Pain   Yes Historical Provider, MD   naproxen (NAPROSYN) 500 MG tablet Take 1 tablet by mouth 2 times daily for 7 days. 05/01/12 05/08/12  Jackalyn Lombardavid J Montanaro, DO   albuterol (PROVENTIL HFA;VENTOLIN HFA) 108 (90 BASE) MCG/ACT inhaler Inhale 2 puffs into the lungs every 6 hours as needed.    Historical Provider, MD   albuterol (PROVENTIL HFA) 108 (90 BASE) MCG/ACT inhaler Inhale 2 puffs into the lungs every 4 hours as needed for Wheezing. 10/06/09 10/06/10  Jessica Conkle-Lagroux, DO       Allergies:    Penicillins    Social History:    reports that he has been smoking Cigars.  He has never used smokeless tobacco. He reports that he drinks alcohol. He reports that he does not use illicit drugs.    Family History:   Father - history of HTN and DM  Mother - no known medical issues    PHYSICAL EXAM:  Vitals:  BP 148/76    Pulse 88    Temp(Src) 98.6 ??F (37 ??C) (Oral)    Resp 16    Ht 5\' 11"  (1.803 m)    Wt 178 lb (80.74 kg)    BMI 24.84 kg/m2      SpO2 98%   General Appearance: well-developed and well nourished, in  no acute distress and alert  Skin: warm and dry  Head: normocephalic and atraumatic  Eyes: pupils equal, round, and reactive to light  ENT: hearing grossly normal bilaterally  Neck: neck supple and non tender without mass   Pulmonary/Chest: decreased breath sounds noted throughout and chest wall tenderness present mid sternal upper chest area radiating to back  Cardiovascular: normal rate, normal S1 and S2, no gallops, intact distal pulses and no carotid bruits  Abdomen: soft, tenderness in the rt upper quadrant , non-distended, bowel sounds hypoactive, no masses and no organomegaly  Extremities: no cyanosis, no clubbing and no edema  Musculoskeletal: normal range of  motion, no joint swelling, deformity or tenderness  Neurologic: no cranial nerve deficit, gait and coordination normal and speech normal      LABS:  Recent Labs      06/08/13   0620   NA  142   K  4.6   CL  102   CO2  28   BUN  17   CREATININE  1.1   GLUCOSE  111*   CALCIUM  9.4       Recent Labs      06/08/13   0620   WBC  15.8*   RBC  4.99   HGB  12.7   HCT  38.8   MCV  77.7*   MCH  25.5*   MCHC  32.8   RDW  15.1*   PLT  172   MPV  8.7       No results found for this basename: POCGLU,  in the last 72 hours    PT/INR:    Lab Results   Component Value Date    PROTIME 10.9 02/05/2010    INR 0.9 02/05/2010     PTT:    Lab Results   Component Value Date    APTT 26.8 02/05/2010   [APTT}  Troponin:    Lab Results   Component Value Date    TROPONINI <0.01 06/08/2013    TROPONINI <0.01 10/06/2009     U/A:    No results found for this basename: NITRU, COLORU, PHUR, LABCAST, WBCUA, RBCUA, MUCUS, TRICHOMONAS, YEAST, BACTERIA, CLARITYU, SPECGRAV, LEUKOCYTESUR, UROBILINOGEN, BILIRUBINUR, BLOODU, Velda Shell, AMORPHOUS       Radiology: Cta Chest W Wo Contrast    06/08/2013   Study:  CTA of the chest.    Clinical Indication: 33 year old male patient with chest pain, painful respiration. Asthma and bronchitis.   Findings: There are streaking artifacts and pixeling of the images and also motion artifacts. 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 No conspicuous central pulmonary emboli are seen in the main PA, right and left main PAs, in their lobar, segment and subsegmental branches. There is no aneurysmal formation or dissection of thoracic aorta. There is an old calcified granuloma with some residual scar in the anterior segment of the left upper lobe. Discrete peripheral atelectasis in the right lower lobe. There are no pleural effusions seen.   Mediastinum appear  unremarkable. The upper abdominal structures are not fully covered on this study. Adrenal glands are not enlarged.      06/08/2013   IMPRESSION:  1. Some dilatation of the present study due the pixeling of the images/motion artifacts and the streaking artifacts. 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 No conspicuous central pulmonary emboli. However correlation with ventilation/perfusion lung scan is recommended for reassurance.   2. No aneurysm formation or dissection of thoracic aorta.   3. No acute cardiopulmonary process.   4. Residual scar with calcification in the left upper lobe. Discrete atelectasis in the right lower lobe.     Nm Lung Ventilation Vq Perfusion    06/08/2013   Examination: Ventilation/perfusion lung scan.   Clinical Indication: Pleuritic chest pain.   Study for comparison: Chest CTA Jun 08, 2013 with artifacts noted.   Technique and Findings: Posterior view ventilation lung scan obtained with patient inhaling 10.2 mCi of Xe-133 shows normal distribution of first breath and equilibrium ventilation. During the washout phase there is no air-trapping. Eight view perfusion lung scan obtained following intravenous injection of 7.9 mCi of technetium 7363m MAA shows normal blood flow to both lungs.      06/08/2013   IMPRESSION: Normal examination. No evidence of pulmonary emboli.      EKG: Sinus Rhythm    ASSESSMENT:      Active Problems:    Pneumonia    Acute chest wall pain    Tobacco abuse    Back pain      PLAN:    1. Pneumonia - (CAP) -with pleuritic chest pain  discrete atelectasis in right upper lobe - admit medical floor - IV Levaquin continued - breathing treatments continued ,stat of toradol and percocet 10/325 mg q 4 hours.  2. Rt upper quadrant  Pain - r/o chololithiasis Lfts not done we will order in am and order and abdominal ultrasound      3. Back Pain - continue same pain medication from # 2 - pain most likely d/t  consolidation from pneumonia   4. Tobacco Abuse - smokes cigars - counsel on smoking cessation - offered Nicotine patch refused at this time  5. Positive Urine Drug Screen - no sure if urine was obtained after Dilaudid given (Opiates) positive    Code Status: Full Code  DVT prophylaxis: Lovenox      Electronically signed by Randolph BingErica C Smith, CNS on 06/08/2013 at 12:32 PM      Addendum: I have personally participated  in the history, physical exam, medical decision making with the Advanced Practice Nurse  on the above date of service. I  Reviewed all  the notes written by the Advanced Nurse practitioner  and edited the entire notes where ever appropriate changes are needed . Placed all the orders,reviewed pertinent labs ,  radiological images and  I discussed the treatment  plan with the  patient .        Electronically signed by Rhetta MuraSunita Rosaleah Person, MD on 06/08/2013 at 11:35 PM

## 2013-06-08 NOTE — ED Notes (Signed)
Pt states he developed mid back pain, right sided chest discomfort.  Pain is worse with palpation.      Eden EmmsJill Marie Lillyan Hitson, RN  06/08/13 [phone]

## 2013-06-08 NOTE — ED Notes (Signed)
cmr called,  Rated detected    Audelia HivesDiana Fernie Grimm, RN  06/08/13 [phone]

## 2013-06-08 NOTE — Progress Notes (Signed)
Spoke with US unable to complete b/c pt unsure of when he last ate. Relayed this to Dr. Ames DuraMarnejon, ok to do test in am and NPO after midnight.

## 2013-06-08 NOTE — ED Provider Notes (Signed)
HPI:  06/08/2013, Time: 6:29 AM         Todd Dean is a 33 y.o. male presenting to the ED for chest and back pain, beginning several days ago.  The complaint has been persistent, severe in severity, and worsened by movement and certain positions, bending, twisting, deep breath, cough.  33 year old male patient presents to ER with several days of right chest pain shooting straight through to the right back with associated painful and difficult breathing. He states that he cannot identify any injurious episode, trauma, falls or any other precipitant because the pain. He has not had any recent vigorous activity or heavy lifting that may have caused a muscular-type injury. Doesn't any associated left chest pain, diaphoresis, shortness of breath. Admits to associated painful respiration, worsening pain with cough, certain movements, and deep breath. Touch to the area worsens the pain as well. He denies any fever or chills or anorexia. Denies any abdominal pains, nausea, vomiting, change in bowel patterns, hematochezia, melena.    Review of Systems:   Pertinent positives and negatives are stated within HPI, all other systems reviewed and are negative.          --------------------------------------------- PAST HISTORY ---------------------------------------------  Past Medical History:  has a past medical history of Asthma and Bronchitis.    Past Surgical History:  has past surgical history that includes Appendectomy and Wisdom tooth extraction.    Social History:  reports that he has been smoking Cigars.  He has never used smokeless tobacco. He reports that he drinks alcohol. He reports that he does not use illicit drugs.    Family History: family history is not on file.     The patient???s home medications have been reviewed.    Allergies: Penicillins    -------------------------------------------------- RESULTS -------------------------------------------------  All laboratory and radiology results have been  personally reviewed by myself   LABS:  Results for orders placed during the hospital encounter of 06/08/13   CBC WITH AUTO DIFFERENTIAL       Result Value Range    WBC 15.8 (*) 4.5 - 11.5 E9/L    RBC 4.99  3.80 - 5.80 E12/L    Hemoglobin 12.7  12.5 - 16.5 g/dL    Hematocrit 96.0  45.4 - 54.0 %    MCV 77.7 (*) 80.0 - 99.9 fL    MCH 25.5 (*) 26.0 - 35.0 pg    MCHC 32.8  32.0 - 34.5 %    RDW 15.1 (*) 11.5 - 15.0 fL    Platelets 172  130 - 450 E9/L    MPV 8.7  7.0 - 12.0 fL    PLATELET SLIDE REVIEW Normal      Neutrophils Relative 72  43 - 80 %    Lymphocytes Relative 19 (*) 20 - 42 %    Monocytes Relative 7  2 - 12 %    Eosinophils Relative Percent 2  0 - 6 %    Basophils Relative 0  0 - 2 %    Neutrophils Absolute 11.38 (*) 1.80 - 7.30 E9/L    Lymphocytes Absolute 3.00  1.50 - 4.00 E9/L    Monocytes Absolute 1.11 (*) 0.10 - 0.95 E9/L    Eosinophils Absolute 0.32  0.05 - 0.50 E9/L    Basophils Absolute 0.00  0.00 - 0.20 E9/L    Anisocytosis 1+      Hypochromia 1+      Poikilocytes 1+      Ovalocytes 1+     BASIC  METABOLIC PANEL       Result Value Range    Sodium 142  132 - 146 mmol/L    Potassium 4.6  3.5 - 5.0 mmol/L    Chloride 102  98 - 107 mmol/L    CO2 28  22 - 29 mmol/L    Anion Gap 12  7 - 16 mmol/L    Glucose 111 (*) 74 - 109 mg/dL    BUN 17  6 - 20 mg/dL    CREATININE 1.1  0.7 - 1.2 mg/dL    GFR Non-African American >60  >=60 mL/min/1.73    GFR African American >60      Calcium 9.4  8.6 - 10.2 mg/dL   TROPONIN       Result Value Range    Troponin <0.01  0.00 - 0.03 ng/mL   CK       Result Value Range    Total CK 222 (*) 20 - 200 U/L   CK-MB       Result Value Range    CK-MB <1.0  0.0 - 7.7 ng/mL   D-DIMER, QUANTITATIVE       Result Value Range    D-Dimer, Quant 249     AMYLASE       Result Value Range    Amylase 69  20 - 100 U/L   LIPASE       Result Value Range    Lipase 27  13 - 60 U/L   SERUM DRUG SCREEN       Result Value Range    Ethanol Lvl <10      Acetaminophen Level <15.0  10.0 - 30.0 mcg/mL     Salicylate, Serum <0.3  0.0 - 30.0 mg/dL    TCA Scrn NEGATIVE  ZOXWRU:045 ng/mL   URINE DRUG SCREEN       Result Value Range    Amphetamine Screen, Urine NOT DETECTED  Negative <1000 ng/mL    Barbiturate Screen, Ur NOT DETECTED  Negative < 200 ng/mL    Benzodiazepine Screen, Urine NOT DETECTED  Negative < 200 ng/mL    Cannabinoid Scrn, Ur NOT DETECTED  Negative < 50ng/mL    COCAINE METABOLITE SCREEN URINE NOT DETECTED  Negative < 300 ng/mL    Opiate Scrn, Ur POSITIVE (*) Negative < 300ng/mL    PCP Scrn, Ur NOT DETECTED  Negative < 25 ng/mL    Methadone Screen, Urine NOT DETECTED  Negative <300 ng/mL    Propoxyphene Scrn, Ur NOT DETECTED  Negative <300 ng/mL   COMPREHENSIVE METABOLIC PANEL       Result Value Range    Sodium 140  132 - 146 mmol/L    Potassium 3.7  3.5 - 5.0 mmol/L    Chloride 102  98 - 107 mmol/L    CO2 27  22 - 29 mmol/L    Anion Gap 11  7 - 16 mmol/L    Glucose 132 (*) 74 - 109 mg/dL    BUN 14  6 - 20 mg/dL    CREATININE 1.2  0.7 - 1.2 mg/dL    GFR Non-African American >60  >=60 mL/min/1.73    GFR African American >60      Calcium 9.0  8.6 - 10.2 mg/dL    Total Protein 6.3 (*) 6.4 - 8.3 g/dL    Alb 3.9  3.5 - 5.2 g/dL    Total Bilirubin <4.0  0.0 - 1.2 mg/dL    Alkaline Phosphatase 57  40 - 129 U/L  ALT 9  0 - 40 U/L    AST 11  0 - 39 U/L   CBC WITH AUTO DIFFERENTIAL       Result Value Range    WBC 13.3 (*) 4.5 - 11.5 E9/L    RBC 4.56  3.80 - 5.80 E12/L    Hemoglobin 11.7 (*) 12.5 - 16.5 g/dL    Hematocrit 16.135.6 (*) 37.0 - 54.0 %    MCV 78.1 (*) 80.0 - 99.9 fL    MCH 25.6 (*) 26.0 - 35.0 pg    MCHC 32.8  32.0 - 34.5 %    RDW 15.0  11.5 - 15.0 fL    Platelets 154  130 - 450 E9/L    MPV 9.0  7.0 - 12.0 fL    PLATELET SLIDE REVIEW Normal      Neutrophils Relative 64  43 - 80 %    Lymphocytes Relative 24  20 - 42 %    Monocytes Relative 11  2 - 12 %    Eosinophils Relative Percent 1  0 - 6 %    Basophils Relative 0  0 - 2 %    Neutrophils Absolute 8.51 (*) 1.80 - 7.30 E9/L    Lymphocytes Absolute 3.19   1.50 - 4.00 E9/L    Monocytes Absolute 1.46 (*) 0.10 - 0.95 E9/L    Eosinophils Absolute 0.13  0.05 - 0.50 E9/L    Basophils Absolute 0.00  0.00 - 0.20 E9/L    Hypochromia 1+      Poikilocytes 1+      Ovalocytes 1+     HEPATIC FUNCTION PANEL       Result Value Range    Total Protein 6.5  6.4 - 8.3 g/dL    Alb 4.0  3.5 - 5.2 g/dL    Alkaline Phosphatase 58  40 - 129 U/L    ALT 9  0 - 40 U/L    AST 11  0 - 39 U/L    Total Bilirubin 0.2  0.0 - 1.2 mg/dL    Bilirubin, Direct <0.9<0.2  0.0 - 0.3 mg/dL    Bilirubin, Indirect 0.0  0.0 - 1.0 mg/dL   LACTIC ACID, PLASMA       Result Value Range    Lactic Acid 0.8  0.5 - 2.2 mmol/L   EKG 12-LEAD       Result Value Range    ECG Report        Value: Sinus tachycardiaInferior T wave changes are nonspecificBorderline ECGPREVIOUS TRACING:   10/06/09 21.01      PHYSICIAN  Joseph Noga      ECG Heart Rate 103      ECG RR Interval 582      ECG P Duration 102      ECG QRS Duration 88      P-R Interval 124      Q-T Interval 302      ECG QTC Interval 395      ECG QT Dispersion 8      P Axis 65      ECG QRS Axis 17      T Axis 0     EKG 12-LEAD       Result Value Range    ECG Report        Value: Sinus rhythmInferior T wave changes are nonspecificBorderline ECGPREVIOUS TRACING: 06/08/13   06.13      PHYSICIAN  Jessica LaGroux      ECG Heart Rate 94  ECG RR Interval 638      ECG P Duration 112      ECG QRS Duration 86      P-R Interval 132      Q-T Interval 314      ECG QTC Interval 393      ECG QT Dispersion 38      P Axis 64      ECG QRS Axis 27      T Axis -4         RADIOLOGY:  Interpreted by Radiologist.  CTA CHEST W WO CONTRAST    Final Result: IMPRESSION:      1. Some dilatation of the present study due the pixeling of the    images/motion artifacts and the streaking artifacts. 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 No conspicuous central    pulmonary emboli. However correlation with ventilation/perfusion     lung scan is recommended for reassurance.         2. No aneurysm formation or dissection of thoracic aorta.         3. No acute cardiopulmonary process.         4. Residual scar with calcification in the left upper lobe.    Discrete atelectasis in the right lower lobe.    NM LUNG VENTILATION VQ PERFUSION    Final Result: IMPRESSION: Normal examination. No evidence of pulmonary emboli.       US ABDOMINAL LIMITED    (Results Pending)       ------------------------- NURSING NOTES AND VITALS REVIEWED ---------------------------   The nursing notes within the ED encounter and vital signs as below have been reviewed.   BP 146/79    Pulse 91    Temp(Src) 100.1 ??F (37.8 ??C) (Oral)    Resp 18    Ht 5\' 11"  (1.803 m)    Wt 180 lb 1.6 oz (81.693 kg)    BMI 25.13 kg/m2      SpO2 96%   Oxygen Saturation Interpretation: Normal      ---------------------------------------------------PHYSICAL EXAM--------------------------------------      Constitutional/General: Alert and oriented x3, well appearing, non toxic in NAD  Head: Normocephalic and atraumatic  Eyes: PERRL, EOMI  Mouth: Oropharynx clear, handling secretions, no trismus  Neck: Supple, full ROM,   Pulmonary: Lungs clear to auscultation bilaterally, no wheezes, rales, or rhonchi. Respirations are sharply ceased at times due to pain.  Not in respiratory distress  Patient guards the right chest wall during cough and deep respiration. The right chest wall is palpated with reproduction of symptoms. The right scapular upper back area is palpated with reproduction of symptoms. Movement on ER med during examination positively reviewed reproduces symptoms of complaint.  Cardiovascular:  Regular rate and rhythm, no murmurs, gallops, or rubs. 2+ distal pulses  Abdomen: Soft, non tender, non distended, all sounds are positive in all quadrants. Negative retroperitoneal signs on examination. CVA tenderness.  Extremities: Moves all extremities x 4. Warm and well perfused  Skin: warm and  dry without rash  Neurologic: GCS 15, focal or unilateral neurologic deficits. Memory and cognition are intact.  Psych: Normal Affect      ------------------------------ ED COURSE/MEDICAL DECISION MAKING----------------------  Medications   sodium chloride flush 0.9 % injection 10 mL (10 mLs Intravenous Given 06/08/13 2054)   sodium chloride flush 0.9 % injection 10 mL (10 mLs Intravenous Given 06/09/13 0434)   acetaminophen (TYLENOL) tablet  650 mg (not administered)   magnesium hydroxide (MILK OF MAGNESIA) 400 MG/5ML suspension 30 mL (not administered)   ondansetron (ZOFRAN) injection 4 mg (not administered)   enoxaparin (LOVENOX) injection 40 mg (40 mg Subcutaneous Not Given 06/08/13 1526)   levofloxacin (LEVAQUIN) 500 MG/100ML infusion 500 mg (not administered)   ipratropium-albuterol (DUONEB) nebulizer solution 1 ampule ( Inhalation Canceled Entry 06/08/13 2232)   budesonide (PULMICORT) nebulizer suspension 500 mcg (500 mcg Nebulization Given 06/08/13 2232)   ketorolac (TORADOL) injection 30 mg (30 mg Intravenous Given 06/09/13 0434)   oxyCODONE-acetaminophen (PERCOCET) 10-325 MG per tablet 1 tablet (1 tablet Oral Given 06/09/13 0247)   pantoprazole (PROTONIX) injection 40 mg (40 mg Intravenous Given 06/08/13 2249)     And   sodium chloride (PF) 0.9 % injection 10 mL (10 mLs Intravenous Given 06/08/13 2249)   morphine (PF) injection 4 mg (4 mg Intravenous Given 06/08/13 0617)   ondansetron (ZOFRAN) injection 4 mg (4 mg Intravenous Given 06/08/13 0616)   0.9 % sodium chloride bolus (0 mLs Intravenous Stopped 06/08/13 0744)   HYDROmorphone HCl PF (DILAUDID) injection 1 mg (1 mg Intravenous Given 06/08/13 0744)   ioversol (OPTIRAY) 68 % injection 100 mL (100 mLs Intravenous Given 06/08/13 0758)   ketorolac (TORADOL) injection 30 mg (30 mg Intravenous Given 06/08/13 0853)   technetium albumin aggregated (MAA) solution 6 milli Curie (6 milli Curies Intravenous Given 06/08/13 0945)   xenon (Xe-133) gas 10 milli Curie (10 milli Curies Inhalation  Given 06/08/13 0945)   aspirin chewable tablet 324 mg (324 mg Oral Given 06/08/13 1127)   levofloxacin (LEVAQUIN) 750 MG/150ML infusion 750 mg (0 mg Intravenous Stopped 06/08/13 1342)         Medical Decision Making:    This is a 33 year old patient who arrived to the emergency department complaining of several days of pleuritic chest discomfort without precipitating injurious event. His personal risk index unit a low suspicion for pulmonary embolism but, due to the ambiguous nature of his presenting symptoms a thorough diagnostic evaluation with labs and CT scan was conducted. He had a rather difficult CT A of the chest are read per radiology interpretation at a recommend ventilation perfusion scan follow-up was conducted. The ventilation perfusion scan follow-up regarding normal examination with no evidence of pulmonary embolism. During the course of his evaluation an EKG was done showing some EKG abnormalities that were not acute in nature. He was also incidentally found to have a right lower lobe pneumonia. The patient remained clinically and hemodynamically stable during the entirety of his emergency department stay. His care was transferred to Dr. Shanda BumpsJessica Conkle-LaGroux prior to the completion of his ER visit. Please refer to her addendum documentation for information on discharge instructions as well as prescriptions.    Counseling:   The emergency provider has spoken with the patient and discussed today???s results, in addition to providing specific details for the plan of care and counseling regarding the diagnosis and prognosis.  Questions are answered at this time and they are agreeable with the plan.      --------------------------------- IMPRESSION AND DISPOSITION ---------------------------------    IMPRESSION  1. Pleuritic chest pain    2. EKG abnormalities    3. Pneumonia, organism unspecified        DISPOSITION  Care transferred to Dr. Shanda BumpsJessica Conkle-LaGroux tired to disposition  Patient condition is  stable      NOTE: This report was transcribed using voice recognition software. Every effort was made to ensure accuracy; however, inadvertent  computerized transcription errors may be present              Elberta Fortis, NP  06/09/13 [phone]

## 2013-06-08 NOTE — ED Notes (Signed)
[facility]    Baldo Asharole A Kiko Ripp, RN  06/08/13 1202

## 2013-06-09 LAB — COMPREHENSIVE METABOLIC PANEL
ALT: 9 U/L (ref 0–40)
AST: 11 U/L (ref 0–39)
Albumin: 3.9 g/dL (ref 3.5–5.2)
Alkaline Phosphatase: 57 U/L (ref 40–129)
Anion Gap: 11 mmol/L (ref 7–16)
BUN: 14 mg/dL (ref 6–20)
CO2: 27 mmol/L (ref 22–29)
Calcium: 9 mg/dL (ref 8.6–10.2)
Chloride: 102 mmol/L (ref 98–107)
Creatinine: 1.2 mg/dL (ref 0.7–1.2)
GFR African American: >60
GFR Non-African American: >60 mL/min/{1.73_m2} (ref >=60)
Glucose: 132 mg/dL — ABNORMAL HIGH (ref 74–109)
Potassium: 3.7 mmol/L (ref 3.5–5.0)
Sodium: 140 mmol/L (ref 132–146)
Total Bilirubin: <0.2 mg/dL (ref 0.0–1.2)
Total Protein: 6.3 g/dL — ABNORMAL LOW (ref 6.4–8.3)

## 2013-06-09 LAB — EKG 12-LEAD
ECG Heart Rate: 103 /min
ECG Heart Rate: 94 /min
ECG P Duration: 102 ms
ECG P Duration: 112 ms
ECG QRS Axis: 17 deg
ECG QRS Axis: 27 deg
ECG QRS Duration: 86 ms
ECG QRS Duration: 88 ms
ECG QT Dispersion: 38 ms
ECG QT Dispersion: 8 ms
ECG QTC Interval: 393 ms
ECG QTC Interval: 395 ms
ECG RR Interval: 582 ms
ECG RR Interval: 638 ms
P Axis: 64 deg
P Axis: 65 deg
P-R Interval: 124 ms
P-R Interval: 132 ms
Q-T Interval: 302 ms
Q-T Interval: 314 ms
T Axis: -4 deg
T Axis: 0 deg

## 2013-06-09 LAB — CBC WITH AUTO DIFFERENTIAL
Basophils %: 0 % (ref 0–2)
Basophils Absolute: 0 E9/L (ref 0.00–0.20)
Eosinophils %: 1 % (ref 0–6)
Eosinophils Absolute: 0.13 E9/L (ref 0.05–0.50)
Hematocrit: 35.6 % — ABNORMAL LOW (ref 37.0–54.0)
Hemoglobin: 11.7 g/dL — ABNORMAL LOW (ref 12.5–16.5)
Lymphocytes %: 24 % (ref 20–42)
Lymphocytes Absolute: 3.19 E9/L (ref 1.50–4.00)
MCH: 25.6 pg — ABNORMAL LOW (ref 26.0–35.0)
MCHC: 32.8 % (ref 32.0–34.5)
MCV: 78.1 fL — ABNORMAL LOW (ref 80.0–99.9)
MPV: 9 fL (ref 7.0–12.0)
Monocytes %: 11 % (ref 2–12)
Monocytes Absolute: 1.46 E9/L — ABNORMAL HIGH (ref 0.10–0.95)
Neutrophils %: 64 % (ref 43–80)
Neutrophils Absolute: 8.51 E9/L — ABNORMAL HIGH (ref 1.80–7.30)
PLATELET SLIDE REVIEW: NORMAL
Platelets: 154 E9/L (ref 130–450)
RBC: 4.56 E12/L (ref 3.80–5.80)
RDW: 15 fL (ref 11.5–15.0)
WBC: 13.3 E9/L — ABNORMAL HIGH (ref 4.5–11.5)

## 2013-06-09 LAB — HEPATIC FUNCTION PANEL
ALT: 9 U/L (ref 0–40)
AST: 11 U/L (ref 0–39)
Albumin: 4 g/dL (ref 3.5–5.2)
Alkaline Phosphatase: 58 U/L (ref 40–129)
Bilirubin, Direct: <0.2 mg/dL (ref 0.0–0.3)
Bilirubin, Indirect: 0 mg/dL (ref 0.0–1.0)
Total Bilirubin: 0.2 mg/dL (ref 0.0–1.2)
Total Protein: 6.5 g/dL (ref 6.4–8.3)

## 2013-06-09 LAB — LACTIC ACID: Lactic Acid: 0.8 mmol/L (ref 0.5–2.2)

## 2013-06-09 MED ORDER — LEVOFLOXACIN 750 MG PO TABS
750 MG | ORAL_TABLET | Freq: Every day | ORAL | Status: AC
Start: 2013-06-09 — End: 2013-06-14

## 2013-06-09 MED ORDER — OXYCODONE-ACETAMINOPHEN 10-325 MG PO TABS
10-325 MG | ORAL_TABLET | Freq: Three times a day (TID) | ORAL | Status: DC | PRN
Start: 2013-06-09 — End: 2013-06-16

## 2013-06-09 MED ORDER — BENZONATATE 100 MG PO CAPS
100 MG | ORAL_CAPSULE | Freq: Three times a day (TID) | ORAL | Status: AC | PRN
Start: 2013-06-09 — End: 2013-06-16

## 2013-06-09 MED ORDER — BUDESONIDE-FORMOTEROL FUMARATE 80-4.5 MCG/ACT IN AERO
Freq: Two times a day (BID) | RESPIRATORY_TRACT | Status: DC
Start: 2013-06-09 — End: 2013-09-22

## 2013-06-09 MED ORDER — METHYLPREDNISOLONE (PAK) 4 MG PO TABS
4 MG | ORAL_TABLET | ORAL | Status: DC
Start: 2013-06-09 — End: 2013-06-16

## 2013-06-09 MED ORDER — OXYCODONE-ACETAMINOPHEN 10-325 MG PO TABS
10-325 | ORAL | Status: DC | PRN
Start: 2013-06-09 — End: 2013-06-09
  Administered 2013-06-09 (×3): 1 via ORAL

## 2013-06-09 MED ORDER — KETOROLAC TROMETHAMINE 30 MG/ML IJ SOLN
30 | Freq: Four times a day (QID) | INTRAMUSCULAR | Status: DC | PRN
Start: 2013-06-09 — End: 2013-06-09
  Administered 2013-06-09: 09:00:00 30 mg via INTRAVENOUS

## 2013-06-09 MED ADMIN — sodium chloride flush 0.9 % injection 10 mL: 10 mL | INTRAVENOUS | @ 09:00:00

## 2013-06-09 MED ADMIN — ipratropium-albuterol (DUONEB) nebulizer solution 1 ampule: 1 | RESPIRATORY_TRACT | @ 13:00:00 | NDC 00487020101

## 2013-06-09 MED ADMIN — pantoprazole (PROTONIX) injection 40 mg: 40 mg | INTRAVENOUS | @ 03:00:00 | NDC 00008400101

## 2013-06-09 MED ADMIN — sodium chloride flush 0.9 % injection 10 mL: 10 mL | INTRAVENOUS | @ 13:00:00

## 2013-06-09 MED ADMIN — sodium chloride (PF) 0.9 % injection 10 mL: 10 mL | INTRAVENOUS | @ 03:00:00

## 2013-06-09 MED ADMIN — sodium chloride flush 0.9 % injection 10 mL: 10 mL | INTRAVENOUS | @ 03:00:00

## 2013-06-09 MED ADMIN — sodium chloride flush 0.9 % injection 10 mL: 10 mL | INTRAVENOUS | @ 01:00:00

## 2013-06-09 MED ADMIN — ipratropium-albuterol (DUONEB) nebulizer solution 1 ampule: 1 | RESPIRATORY_TRACT | @ 03:00:00 | NDC 00487020101

## 2013-06-09 MED FILL — OXYCODONE-ACETAMINOPHEN 10-325 MG PO TABS: 10-325 MG | ORAL | Qty: 1

## 2013-06-09 MED FILL — NORMAL SALINE FLUSH 0.9 % IV SOLN: 0.9 % | INTRAVENOUS | Qty: 10

## 2013-06-09 MED FILL — KETOROLAC TROMETHAMINE 30 MG/ML IJ SOLN: 30 MG/ML | INTRAMUSCULAR | Qty: 1

## 2013-06-09 MED FILL — NORMAL SALINE FLUSH 0.9 % IV SOLN: 0.9 % | INTRAVENOUS | Qty: 20

## 2013-06-09 MED FILL — PROTONIX 40 MG IV SOLR: 40 MG | INTRAVENOUS | Qty: 40

## 2013-06-09 MED FILL — SODIUM CHLORIDE 0.9 % IJ SOLN: 0.9 % | INTRAMUSCULAR | Qty: 10

## 2013-06-09 NOTE — Care Coordination-Inpatient (Signed)
Case Management/Social Work Initial Assessment  Discharge Planning                    Report Generated 06/09/2013 :11:41 AM        Contacts:                 Contact 1: Name: Todd Dean  Contact 1: Relationship: Wife                   Contact 1: Number: [phone]   [phone]                    Current Living Conditions/Services:        Type of Residence: Private Residence                                Current Services Prior To Admission: None                 Assessment:      Abuse Assessment  Physical Abuse: Denies  Verbal Abuse: Denies  Emotional Abuse: Denies  Financial Abuse: Denies  Sexual Abuse: Denies  Elder abuse: No                 Readmission Risk           Readmission Risk:       1.25       Age 89 or Greater: 0    Admitted from SNF or Requires Paid or Family Care: 0    Currently has CHF,COPD,ARF,CRI,or is on dialysis: 0    Takes more than 5 Prescription Medications: 0    Takes Digoxin,Insulin,Anticoagulants,Narcotics or ASA/Plavix: [place] in Past 12 Months: 0    On Disability: 0    Patient Considers own Health: 1.25              Anticipated Discharge Plan:  Discharge Planning  Type of Residence: Private Residence  Living Arrangements: Spouse/Significant Other  Support Systems: Spouse/Significant Other  Current Services Prior To Admission: None  Potential Assistance Needed: N/A  Type of Home Care Services: None  Patient expects to be discharged to:: home  Expected Discharge Date: 06/09/13        Case Management/Social Work Comments:   Social Work consult noted.  Met with patient, his wife York Cerise and son in patient room.  Explained Social Work role and discussed discharge planning.  Patient stated his PCP was Dr. Migdalia Dk but he hasn't seen in a year.  Patient and his wife is okay with this worker arranging appointment at [facility].  Called Solmon Ice at [facility] and scheduled an appointment for Monday 06/16/13 at 10:30am.  Notified patient and his  wife of appointment time and provided them with clinic address.  Informed patient to bring his identification, insurance card and list of medications to appointment.  Patient to discharge home today.  Electronically signed by Sharene Butters, LSW on 06/09/2013 at 11:55 AM

## 2013-06-09 NOTE — Discharge Summary (Signed)
Yetta BarreJONES, [facility] LLCEMMANUEL                                         [id number]      ADMITTED:  06/08/2013                       DISCHARGED:  06/09/2013      PRIMARY CARE PHYSICIAN:  Sondra ComeArmand Minotti, D.O.      ADMISSION DIAGNOSES:   1.    Community-acquired pneumonia with pleuritic chest pain.   2.    History of tobacco abuse.   3.    History of positive drug screen likely on the basis of Dilaudid.      DISCHARGE DIAGNOSES:   1.    Community-acquired pneumonia with pleuritic chest pain.   2.    History of tobacco abuse.   3.    History of positive drug screen likely on the basis of Dilaudid.      BRIEF HISTORY OF PRESENT ILLNESS:   This is a polite and pleasant,   33 year old male who presented with chest and back pain with significant   coughing and shortness of breath prior to his admission.  The patient has a   history of 2-3 days cigar smoking.  The patient denied any fever or chills   but reported the significant coughing that elicited all of his discomfort and   that is why he was seen.      HOSPITAL COURSE AND TREATMENT:  The patient was seen by the [facility].   Routine laboratories and imaging studies were obtained.  The patient stated   that, since starting his Toradol for pain, his pain overall has, for the most   part, resolved but states that the Percocet also has been helping his pain.   His shortness of breath has also improved.  The patient was coughing so hard   that he was reporting some right-sided pain as well, which we did evaluate   with the right upper quadrant ultrasound which was unremarkable.  The   patient's leukocytosis has been resolving, and the patient is not requiring   any oxygen at this time.  The patient has been getting breathing treatments   as well, which have also improved his shortness of breath.      BRIEF PHYSICAL EXAMINATION ON DATE OF DISCHARGE:   Vitals:  Temperature 98.6,   respirations 18, pulse 78, blood pressure 142/79, saturating 97% on room air.   HEENT:  Head is atraumatic,  normocephalic.  Eyes:  Pupils equal, round, and   reactive to light and accommodation.  Extraocular movements intact.  Neck was   supple.  Full range of motion.  No JVD noted.  No bruits noted.   Cardiovascular reveals a regular rate and rhythm, S1, S2, no murmurs, rubs,   or gallops.  Lungs: Fairly clear bilaterally with minimal wheezing on the   right lung, otherwise clear.  No rhonchi or rales noted.  Abdomen was soft,   nontender, nondistended.  Normoactive bowel sounds.  No rebound, guarding,   rigidity.  Neurological:  Alert and oriented x3 with 5/5 strength throughout.   No focal deficits.  Cranial nerves 2 through 12 grossly intact.  Skin is   warm, dry, intact, and nondiaphoretic.      PERTINENT LABORATORIES ON DAY OF DISCHARGE:  WBC 13.3, hemoglobin 11.7,  hematocrit 35.6, platelet count 154.  Sodium 140, potassium 3.7, chloride   102, CO2 is 27, anion gap 11.  Glucose 132, BUN 14, creatinine 1.2.  ALT 9,   AST 11.  Ultrasound of the abdomen, right upper quadrant was unremarkable.      DISPOSITION/FOLLOWUP:  The patient's discharge disposition is home with   recommend follow up with PCP in 1 week.      DISCHARGE MEDICATIONS:  Discharge medications include Percocet 10/325 every   _____ hours as needed for pain, Levaquin 750 mg for 5 total days more,   Symbicort inhaler 2 puffs into lungs twice daily, Tessalon Perles 100 mg 3   times daily as needed for cough, and a Medrol Dosepak.      TIME STATEMENT:  Greater than 30 minutes was spent reviewing chart,   formulating discharge plan, and dictating this discharge summary.            Dictated by:  Caryl Neverajiv Harjit Douds, D.O.            RA / nmt   DD: 06/09/2013   10:11 A    DT: 06/09/2013 11:57 A   [id number]    [id number]   CC:   ARMAND L. MINOTTI, D.O.         Rhetta MuraSUNITA RAYUDU, M.D.

## 2013-06-09 NOTE — Other (Signed)
[facility] Quality Flow/Interdisciplinary Rounds Progress Note        Quality Flow Rounds held on Jun 09, 2013    Disciplines Attending:  Bedside Nurse, Social Worker, Case Manager and Nursing Unit Leadership    Todd Dean was admitted on 06/08/2013 11:05 AM    Anticipated Discharge Date:  Expected Discharge Date: 06/09/13    Disposition:    Braden Score:  Braden Scale Score: 22    Readmission Score:         Discussed patient goal for the day, patient clinical progression, and barriers to discharge.  The following Goal(s) of the Day/Commitment(s) have been identified:  Other  IV ATB, u/s abd, check general floor      [facility] Dig[facility] CareMary Aydia Maj  Jun 09, 2013

## 2013-06-09 NOTE — Progress Notes (Signed)
Chart check complete

## 2013-06-09 NOTE — Discharge Summary (Signed)
Dc summary #[id number]    Orine Goga D.O.    Greater than 30 minutes spent review chart, formulating discharge plan and dictating

## 2013-06-09 NOTE — H&P (Deleted)
Dc summary #[id number]    Enyla Lisbon D.O.    Greater than 30 minutes spent review chart, formulating discharge plan and dictating

## 2013-06-11 LAB — OPIATE, QUANTITATIVE, URINE
6AM Urine: <20 ng/mL
Codeine, Urine: <20 ng/mL
Hydrocodone, Urine: <20 ng/mL
Hydromorphone, Urine: 342 ng/mL
Morphine Urine: 456 ng/mL
Norhydrocodone, Urine: <20 ng/mL
Noroxycodone, Urine: <20 ng/mL
Noroxymorphone, Urine: <20 ng/mL
Opiate, Urine Interpretation: POSITIVE
Oxycodone, Urine Confirmation: <20 ng/mL
Oxymorphone, Urine: <20 ng/mL

## 2013-06-13 LAB — CULTURE BLOOD #1: Blood Culture, Routine: 5

## 2013-06-13 LAB — CULTURE, BLOOD 2: Culture, Blood 2: 5

## 2013-06-16 NOTE — Progress Notes (Signed)
Urgent Care Note   Internal Medicine Residency      SUBJECTIVE:   Todd Dean is a 33 y.o. male, comes to the Urgent care clinic today for ER f/u      Past medical, surgical, family histories and med lists were reviewed and updated with patient.     Previous chart reviewed    Pt came to [facility] ER on 5/4 for pleuritic chest pain and fever. He was diagnosed of bronchitis and d/c'd on levaquin and medrol pak. Pt finished the whole treatment course, had no more pain /fever. No wheezing. He used albuterol only during URI.     Review Of Systems:  Eyes: no blurring, diplopia, or eye pain  Respiratory: no cough, pleuritic chest pain, dyspnea, or wheezing  Cardiovascular: no chest pain, angina, dyspnea on exertion, orthopnea, PND, palpitations, or claudication  Gastrointestinal: no nausea, vomiting, heartburn, diarrhea, constipation, bloating,  abdominal pain, or blood per rectum  Genitourinary: no urinary urgency, frequency, dysuria, nocturia, hesitancy, or incontinence    OBJECTIVE:  Vital: BP 133/81    Pulse 91    Temp(Src) 98.8 ??F (37.1 ??C) (Oral)    Resp 18    Ht 5\' 11"  (1.803 m)    Wt 187 lb 12.8 oz (85.186 kg)    BMI 26.20 kg/m2     General appearance: Alert, Awake, Oriented times 3, no distress  Skin: Warm and dry  HEENT: Normocephalic. No masses, lesions or tenderness noted. Conjunctivae appear normal. PERRL. External ears normal. Nares normal. Septum midline. Mucosa normal. No drainage.Oropharynx clear with no exudate seen. Neck supple. No jugular venous distension, lymphadenopathy or thyromegaly Trachea midline  Lungs: Lungs clear to auscultation bilaterally.  No rhonchi, crackles or wheezes  Heart: S1 S2  Regular rate and rhythm. No rub, murmur or gallop  Abdomen: Abdomen soft, non-tender. BS normal. No masses, organomegaly  Musculokeletal: No joint swelling, no muscle tenderness. ROM normal in all joints of extremities.   Neuro: Alert&Oriented. CNII-XII grossly intact. Normal and symmetric strength in UEs and  LEs, DTRs 2+ and symmetric, Babinski sign is absent (flexor), normal gait, coordination with finger to nose, heel to shin and repetitive;movement is intact. Romberg negative; Sensation intact    ASSESSMENT/PLAN:     Acute bronchitis  - sx resolved after abx, and medrol pak    Asthma  - sx triggered by URI  - albuterol inhaler prn    Microcytic anemia  - check iron study    Pt has insurance, is looking for a new PCP, used to f/u with Dr. Callie FieldingMinotti, last seen over 1 yr ago. Offer pt to f/u with [facility], if not establish with anyone.       Dr. Kristine RoyalXiaojing Lelania Bia M.D. PGY-2 Internal Medicine

## 2013-06-16 NOTE — Progress Notes (Signed)
Discharge instructions have been discussed with the patient.  Patient advised to call our office with any questions or concerns. Patient voices understanding.Lab orders given to patient with instructions.

## 2013-06-16 NOTE — Progress Notes (Signed)
Attending Physician Statement  I have discussed the case, including pertinent history and exam findings with the resident. I have seen and examined the patient and the key elements of the encounter have been performed by me.  I agree with the assessment, plan and orders as documented by the resident.      Patient is here to establish care as a new patient in the clinic.      S/p ED encouter for pleurtic /costochondral chest wall pain s/p coughing acute bronchtiis    Equivocal CT --so VQ done negative for PE  ED doc states infiltrate but CT chest not stating this.  Given levquin, pain meds/opiates, prednisone, inhalers-- better    Claims hx of asthma-- most likely post infectious temporary bronchospasms-- now stable  +opiates on drug screen  Tobacco abuse--cigars advised to quit    Microcytic anemia-- ACD vs thalessemia  (no family hx)  Borderline inc BS--will recheck  Lipids on fu.  Remainder of medical problems as per resident note.

## 2013-06-16 NOTE — Patient Instructions (Signed)
Call [phone]  the end of May to make an appointment for July to be established with a primary care physician.  Take your lab orders to the lab with you. You do not need an appointment. Have the lab work done on the date specified on the order.

## 2013-09-22 ENCOUNTER — Inpatient Hospital Stay
Admit: 2013-09-22 | Discharge: 2013-09-22 | Disposition: A | Discharge status: Home / Self Care | Attending: Emergency Medicine

## 2013-09-22 MED ORDER — CLINDAMYCIN HCL 150 MG PO CAPS
150 MG | ORAL_CAPSULE | Freq: Three times a day (TID) | ORAL | Status: AC
Start: 2013-09-22 — End: 2013-10-02

## 2013-09-22 MED ORDER — NAPROXEN 500 MG PO TABS
500 MG | ORAL_TABLET | Freq: Two times a day (BID) | ORAL | Status: DC
Start: 2013-09-22 — End: 2014-06-08

## 2013-09-22 NOTE — Discharge Instructions (Signed)
Dental Pain: After Your Visit  Your Care Instructions  The most common cause of dental pain is tooth decay. It can also be caused by an infection of the tooth (abscess) or gum, a tooth that has not broken all the way through the gum (impacted tooth), or a problem with the nerve-filled center of the tooth.  Follow-up care is a key part of your treatment and safety. Be sure to make and go to all appointments, and call your doctor if you are having problems. It's also a good idea to know your test results and keep a list of the medicines you take.  How can you care for yourself at home?   Contact a dentist for follow-up care.   Put ice or a cold pack on the outside of your mouth for 10 to 20 minutes at a time to reduce pain and swelling. Put a thin cloth between the ice and your skin.   Take an over-the-counter pain medicine, such as acetaminophen (Tylenol), ibuprofen (Advil, Motrin), or naproxen (Aleve). Read and follow all instructions on the label.   Do not take two or more pain medicines at the same time unless the doctor told you to. Many pain medicines have acetaminophen, which is Tylenol. Too much acetaminophen (Tylenol) can be harmful.   Rinse your mouth with warm salt water every 2 hours to help relieve pain and swelling from an infected tooth. Mix 1 teaspoon of salt in 8 ounces of water.   If your doctor prescribed antibiotics, take them as directed. Do not stop taking them just because you feel better. You need to take the full course of antibiotics.  When should you call for help?  Call your doctor now or seek immediate medical care if:   You have signs of infection, such as:   Increased pain, swelling, warmth, or redness.   Pus draining from the gum, tooth, or face.   A fever.  Watch closely for changes in your health, and be sure to contact your doctor if:   You do not get better as expected.   Where can you learn more?   Go to [email or web address] and sign in to your  MyChart account. Enter V264 in the Search Health Information box to learn more about "Dental Pain: After Your Visit."    If you do not have an account, please click on the "Sign Up Now" link.      2006-2014 [employer]. Care instructions adapted under license by [facility]. This care instruction is for use with your licensed healthcare professional. If you have questions about a medical condition or this instruction, always ask your healthcare professional. [employer] Inc[employer] disclaims any warranty or liability for your use of this information.  Content Version: 10.0.273164; Last Revised: Jun 23, 2011

## 2013-09-22 NOTE — ED Notes (Signed)
PA examining.    Waymon BudgeKay L Coco Sharpnack, RN  09/22/13 [phone]

## 2013-09-22 NOTE — ED Notes (Signed)
Dr Belenda Cruise examining.    Jerelene Redden, RN  09/22/13 [phone]

## 2013-09-22 NOTE — ED Provider Notes (Signed)
??  Department of Emergency Medicine   ED  Provider Note  Admit Date/RoomTime: 09/22/2013  2:16 PM  ED Room: 15/15    09/22/13  9:26 PM    Chief Complaint: Dental Pain; and Ankle Pain       History of Present Illness   Source of history provided by:  patient.  History/Exam Limitations: none.       Todd Dean is a 33 y.o. male who has a past medical history of:   Past Medical History   Diagnosis Date   ??? Asthma    ??? Bronchitis    ??? Pneumonia     presents to the ED by private car and is alone for dental pain and ankle pain, beginning several days ago.  The complaint has been persistent, moderate in severity.  Patient states that 2 days ago he had right lower dental pain. The pain has been persistent since that time. He believes he lost a filling. She also states that for the past year he has had left ankle pain. He has been seen by primary care provider and podiatry for possible pinched nerve in the left ankle. Patient presents today for pain medication.    ROS    Pertinent positives and negatives are stated within HPI, all other systems reviewed and are negative.    Past Surgical History   Procedure Laterality Date   ??? Appendectomy     ??? Wisdom tooth extraction     Social History:  reports that he has been smoking Cigars.  He has never used smokeless tobacco. He reports that he drinks alcohol. He reports that he does not use illicit drugs.  Family History: family history is not on file.  Allergies: Penicillins    Physical Exam         VS:  BP 130/86 mmHg   Pulse 96   Temp(Src) 99 ??F (37.2 ??C) (Oral)   Resp 16   Ht 5\' 11"  (1.803 m)   Wt 180 lb (81.647 kg)   BMI 25.12 kg/m2   SpO2 98%   Oxygen Saturation Interpretation: Normal.    ?? Constitutional/General: Alert and oriented x3, well appearing, non toxic in NAD  ?? HEENT:  NC/NT. PERRLA,  Airway patent. Dental fracture noted to the right lower molar. 4 dentition. No dental abscess noted.  ?? Neck: Supple, full ROM, non tender to palpation in the midline, no  stridor, no crepitus, no meningeal signs  ?? Respiratory: Lungs clear to auscultation bilaterally, no wheezes, rales, or rhonchi. Not in respiratory distress  ?? CV:  Regular rate. Regular rhythm. No murmurs, gallops, or rubs. 2+ distal pulses  ?? Musculoskeletal: No tenderness, ecchymosis or swelling to the left ankle. Distal pulses intact. Moves all extremities x 4. Warm and well perfused, no clubbing, cyanosis, or edema. Capillary refill <3 seconds  ?? Integument: skin warm and dry. No rashes.   ?? Lymphatic: no lymphadenopathy noted  ?? Neurologic: GCS 15, no focal deficits, symmetric strength 5/5 in the upper and lower extremities bilaterally  ?? Psychiatric: Normal Affect    Lab / Imaging Results   (All laboratory and radiology results have been personally reviewed by myself)  Labs:  No results found for this visit on 09/22/13.  Imaging:  All Radiology results interpreted by Radiologist unless otherwise noted.     ED Course / Medical Decision Making   Medications - No data to display    Consult(s):   None    Procedure(s):   none  MDM:   Patient states it is really helping for pain medication. He states that Vicodin works best for dental pain and that Ultram works for his ankle pain. Patient is given a prescription for clindamycin and naproxen. He is educated on signs and symptoms return back to emergency department. He was instructed to keep assessment on 09/25/2013. Patient was discharged from the ER in stable condition.    Counseling:    The emergency provider has spoken with the patient and discussed today???s results, in addition to providing specific details for the plan of care and counseling regarding the diagnosis and prognosis.  Questions are answered at this time and they are agreeable with the plan.    Assessment      1. Pain, dental    2. Left ankle pain      Plan   Discharge to home  Patient condition is good    New Medications     Discharge Medication List as of 09/22/2013  3:38 PM      START taking these  medications    Details   clindamycin (CLEOCIN) 150 MG capsule Take 2 capsules by mouth 3 times daily for 10 days, Disp-60 capsule, R-0           Electronically signed by Hulda Humphreyhelsea Aubel, PA   DD: 09/22/13  **This report was transcribed using voice recognition software. Every effort was made to ensure accuracy; however, inadvertent computerized transcription errors may be present.  END OF ED PROVIDER NOTE      Hulda HumphreyChelsea Aubel, [place]  09/22/13 2130    ATTENDING PROVIDER ATTESTATION:     I have personally performed and/or participated in the history, exam, medical decision making, and procedures and agree with all pertinent clinical information.      I have also reviewed and agree with the past medical, family and social history unless otherwise noted.    My findings/Plan:  The patient was examined.    The patient will follow up with a primary care provider      Evonnie PatJames G Jadea Shiffer, MD  09/26/13 [phone]

## 2014-01-05 ENCOUNTER — Emergency Department: Admit: 2014-01-05 | Primary: Family Medicine

## 2014-01-05 ENCOUNTER — Inpatient Hospital Stay: Admit: 2014-01-05 | Discharge: 2014-01-05 | Disposition: A | Discharge status: Home / Self Care

## 2014-01-05 DIAGNOSIS — R059 Cough, unspecified: Secondary | ICD-10-CM

## 2014-01-05 MED ORDER — PREDNISONE 10 MG PO TABS
10 MG | ORAL_TABLET | ORAL | Status: AC
Start: 2014-01-05 — End: 2014-01-15

## 2014-01-05 MED ORDER — GUAIFENESIN-CODEINE 100-10 MG/5ML PO SOLN
100-10 MG/5ML | Freq: Three times a day (TID) | ORAL | Status: AC | PRN
Start: 2014-01-05 — End: 2014-01-10

## 2014-01-05 MED ORDER — ALBUTEROL SULFATE HFA 108 (90 BASE) MCG/ACT IN AERS
108 (90 Base) MCG/ACT | Freq: Four times a day (QID) | RESPIRATORY_TRACT | Status: DC | PRN
Start: 2014-01-05 — End: 2014-06-08

## 2014-01-05 MED ORDER — AZITHROMYCIN 250 MG PO TABS
250 MG | PACK | ORAL | Status: AC
Start: 2014-01-05 — End: 2014-01-15

## 2014-01-05 NOTE — ED Notes (Signed)
Pt states he has had a cough for the past 2 weeks and has been bringing up yellow sputum. Pt reports also injuring his right hand and is now having pain.    Minette Headlandeter G Haruki Arnold, RN  01/05/14 [phone]

## 2014-01-05 NOTE — ED Provider Notes (Signed)
Independent        HPI:  01/05/14,   Time: 4:15 PM         Todd Dean is a 33 y.o. male presenting to the ED for cough and right hand pain. Patient states he has had a cough for 3 weeks. He denies any fever. He states he is a smoker. He states he had asthma when he was a child. He does have history of pneumonia. Patient also states that he is having pain in his right 5th finger. He states on Saturday he went to catch a stone because it was falling and he bent his 5th finger backwards. No history of fracture. No numbness or weakness . Patient denies any fever, chills, fatigue, headache, chest pain, shortness of breath, cough, wheezing, abdominal pain, nausea, vomiting, diarrhea, constipation, numbness, weakness.      ROS:   Pertinent positives and negatives are stated within HPI, all other systems reviewed and are negative.  --------------------------------------------- PAST HISTORY ---------------------------------------------  Past Medical History:  has a past medical history of Asthma; Bronchitis; and Pneumonia.    Past Surgical History:  has past surgical history that includes Appendectomy and Wisdom tooth extraction.    Social History:  reports that he has been smoking Cigars.  He has never used smokeless tobacco. He reports that he drinks alcohol. He reports that he does not use illicit drugs.    Family History: family history is not on file.     The patient???s home medications have been reviewed.    Allergies: Penicillins    -------------------------------------------------- RESULTS -------------------------------------------------  All laboratory and radiology results have been personally reviewed by myself   LABS:  No results found for this visit on 01/05/14.    RADIOLOGY:  Interpreted by Radiologist.  XR HAND RIGHT STANDARD    Final Result: IMPRESSION: No acute fracture or subluxation.       XR CHEST STANDARD TWO VW    Final Result: IMPRESSION:     No airspace opacities or pleural effusion.        ------------------------- NURSING NOTES AND VITALS REVIEWED ---------------------------   The nursing notes within the ED encounter and vital signs as below have been reviewed.   BP 148/105 mmHg   Pulse 70   Temp(Src) 98.3 ??F (36.8 ??C) (Oral)   Resp 16   Ht 5\' 11"  (1.803 m)   Wt 178 lb (80.74 kg)   BMI 24.84 kg/m2   SpO2 94%  Oxygen Saturation Interpretation: Normal      ---------------------------------------------------PHYSICAL EXAM--------------------------------------      Constitutional/General: Alert and oriented x3, well appearing, non toxic in NAD  Head: NC/AT  Eyes: PERRL, EOMI  Mouth: Oropharynx clear, handling secretions, no trismus  Neck: Supple, full ROM, no meningeal signs  Pulmonary: Lungs clear to auscultation bilaterally, no wheezes, rales, or rhonchi. Not in respiratory distress  Cardiovascular:  Regular rate and rhythm, no murmurs, gallops, or rubs. 2+ distal pulses  Abdomen: Soft, non tender, non distended,   Extremities: Moves all extremities x 4. Warm and well perfused. Mild tenderness to palpation to the right 5th MCP area. Full range of motion intact. No edema or ecchymosis. Distal pulses intact  Skin: warm and dry without rash  Neurologic: GCS 15,  Psych: Normal Affect      ------------------------------ ED COURSE/MEDICAL DECISION MAKING----------------------  Medications - No data to display      Medical Decision Making:    Patient with negative chest x-ray and finger x-ray. No nausea and emesis on  exam. Vitals are stable. Advised follow-up with the family doctor if symptoms do not improve    Counseling:   The emergency provider has spoken with the patient and discussed today???s results, in addition to providing specific details for the plan of care and counseling regarding the diagnosis and prognosis.  Questions are answered at this time and they are agreeable with the plan.      --------------------------------- IMPRESSION AND DISPOSITION  ---------------------------------    IMPRESSION  1. Cough    2. Finger sprain, initial encounter        DISPOSITION  Disposition: Discharge to home  Patient condition is good        c          Avis EpleyFranchessca Colella, PA  01/07/14 1045

## 2014-06-08 ENCOUNTER — Inpatient Hospital Stay: Admit: 2014-06-08 | Discharge: 2014-06-08 | Discharge status: Against Medical Advice

## 2014-06-08 ENCOUNTER — Encounter: Admit: 2014-06-09 | Primary: Family Medicine
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
## 2014-06-08 NOTE — Discharge Instructions (Signed)
Learning About Relief for Back Pain  What is back tension and strain?     Back strain happens when you overstretch, or pull, a muscle in your back. You may hurt your back in an accident or when you exercise or lift something.  Most back pain will get better with rest and time. You can take care of yourself at home to help your back heal.  What can you do first to relieve back pain?  When you first feel back pain, try these steps:   Walk. Take a short walk (10 to 20 minutes) on a level surface (no slopes, hills, or stairs) every 2 to 3 hours. Walk only distances you can manage without pain, especially leg pain.   Relax. Find a comfortable position for rest. Some people are comfortable on the floor or a medium-firm bed with a small pillow under their head and another under their knees. Some people prefer to lie on their side with a pillow between their knees. Don't stay in one position for too long.   Try heat or ice. Try using a heating pad on a low or medium setting, or take a warm shower, for 15 to 20 minutes every 2 to 3 hours. Or you can buy single-use heat wraps that last up to 8 hours. You can also try an ice pack for 10 to 15 minutes every 2 to 3 hours. You can use an ice pack or a bag of frozen vegetables wrapped in a thin towel. There is not strong evidence that either heat or ice will help, but you can try them to see if they help. You may also want to try switching between heat and cold.   Take pain medicine exactly as directed.   If the doctor gave you a prescription medicine for pain, take it as prescribed.   If you are not taking a prescription pain medicine, ask your doctor if you can take an over-the-counter medicine.  What else can you do?   Stretch and exercise. Exercises that increase flexibility may relieve your pain and make it easier for your muscles to keep your spine in a good, neutral position. And don't forget to keep walking.   Do self-massage. You can use self-massage to unwind  after work or school or to energize yourself in the morning. You can easily massage your feet, hands, or neck. Self-massage works best if you are in comfortable clothes and are sitting or lying in a comfortable position. Use oil or lotion to massage bare skin.   Reduce stress. Back pain can lead to a vicious circle: Distress about the pain tenses the muscles in your back, which in turn causes more pain. Learn how to relax your mind and your muscles to lower your stress.   Where can you learn more?   Go to [email or web address] and sign in to your MyChart account. Enter Q517 in the Search Health Information box to learn more about "Learning About Relief for Back Pain."    If you do not have an account, please click on the "Sign Up Now" link.      2006-2015 [employer]. Care instructions adapted under license by [facility]. This care instruction is for use with your licensed healthcare professional. If you have questions about a medical condition or this instruction, always ask your healthcare professional. [employer] Inc[employer] disclaims any warranty or liability for your use of this information.  Content Version: 10.6.465758; Current as of: Jun 27, 2013
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
## 2014-06-08 NOTE — ED Notes (Signed)
Called patient, he did not answer    Todd Bonesnne Marie Dynisha Due, RN  06/08/14 [phone]

## 2014-06-09 ENCOUNTER — Inpatient Hospital Stay
Admit: 2014-06-09 | Discharge: 2014-06-09 | Disposition: A | Discharge status: Home / Self Care | Attending: Internal Medicine

## 2014-06-09 MED ORDER — IBUPROFEN 800 MG PO TABS
800 MG | ORAL_TABLET | Freq: Three times a day (TID) | ORAL | Status: DC | PRN
Start: 2014-06-09 — End: 2014-12-03

## 2014-06-09 MED ORDER — HYDROCODONE-ACETAMINOPHEN 5-325 MG PO TABS
5-325 MG | Freq: Once | ORAL | Status: AC
Start: 2014-06-09 — End: 2014-06-08
  Administered 2014-06-09: 03:00:00 1 via ORAL

## 2014-06-09 MED ORDER — CYCLOBENZAPRINE HCL 10 MG PO TABS
10 MG | ORAL_TABLET | Freq: Two times a day (BID) | ORAL | Status: AC | PRN
Start: 2014-06-09 — End: 2014-06-18

## 2014-06-09 MED FILL — HYDROCODONE-ACETAMINOPHEN 5-325 MG PO TABS: 5-325 MG | ORAL | Qty: 1

## 2014-12-03 ENCOUNTER — Inpatient Hospital Stay
Admit: 2014-12-03 | Discharge: 2014-12-03 | Disposition: A | Discharge status: Home / Self Care | Attending: Family Medicine

## 2014-12-03 DIAGNOSIS — M546 Pain in thoracic spine: Secondary | ICD-10-CM

## 2014-12-03 MED ORDER — CYCLOBENZAPRINE HCL 10 MG PO TABS
10 MG | ORAL_TABLET | Freq: Three times a day (TID) | ORAL | 0 refills | Status: AC | PRN
Start: 2014-12-03 — End: 2014-12-13

## 2014-12-03 MED ORDER — IBUPROFEN 800 MG PO TABS
800 MG | ORAL | Status: AC
Start: 2014-12-03 — End: 2014-12-03
  Administered 2014-12-03: 16:00:00 800

## 2014-12-03 MED ORDER — IBUPROFEN 600 MG PO TABS
600 MG | ORAL_TABLET | Freq: Three times a day (TID) | ORAL | 0 refills | Status: AC | PRN
Start: 2014-12-03 — End: 2022-12-06

## 2014-12-03 MED ORDER — KETOROLAC TROMETHAMINE 60 MG/2ML IM SOLN
60 MG/2ML | Freq: Once | INTRAMUSCULAR | Status: DC
Start: 2014-12-03 — End: 2014-12-03

## 2014-12-03 MED FILL — KETOROLAC TROMETHAMINE 60 MG/2ML IM SOLN: 60 MG/2ML | INTRAMUSCULAR | Qty: 2

## 2014-12-03 MED FILL — IBUPROFEN 800 MG PO TABS: 800 MG | ORAL | Qty: 1
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
## 2015-02-14 ENCOUNTER — Inpatient Hospital Stay: Admit: 2015-02-14 | Discharge: 2015-02-14 | Attending: Emergency Medicine

## 2015-02-14 DIAGNOSIS — H8110 Benign paroxysmal vertigo, unspecified ear: Secondary | ICD-10-CM

## 2015-02-14 MED ORDER — SODIUM CHLORIDE 0.9 % IV BOLUS
0.9 % | Freq: Once | INTRAVENOUS | Status: DC
Start: 2015-02-14 — End: 2015-02-15

## 2015-02-14 MED ORDER — MECLIZINE HCL 12.5 MG PO TABS
12.5 MG | Freq: Once | ORAL | Status: DC
Start: 2015-02-14 — End: 2015-02-15

## 2015-02-14 MED FILL — MECLIZINE HCL 12.5 MG PO TABS: 12.5 MG | ORAL | Qty: 2

## 2015-02-14 NOTE — ED Provider Notes (Signed)
??  Department of Emergency Medicine   ED  Provider Note  Admit Date/RoomTime: 02/14/2015  2:13 PM  ED Room: OTF /OTF      History of Present Illness:  02/14/15, Time: 2:20 PM         Todd Dean is a 35 y.o. male presenting to the ED for weakness, beginning last night.  The complaint has been persistent, moderate in severity, and worsened by nothing. Pt arrived to the ED by private car. Pt comes to the ED complaining of weakness, dizziness, chills, slight cough, and lower back pain. He currently goes to a chiropractor to help with his chronic lower back pain and takes ultram for pain. Pt is a current smoker and has hx of pneumonia. He is currently transferring to a new PCP and has not seen Dr. Callie Fielding, DO, in over a year.  Pt denies fever, congestion, chest pain, shortness of breath, loss of consciousness, headache, abdominal pain, vomiting, nausea, diarrhea, constipation, trauma, neck pain, and hematuria.    Review of Systems:   Pertinent positives and negatives are stated within HPI, all other systems reviewed and are negative.    --------------------------------------------- PAST HISTORY ---------------------------------------------  Past Medical History:  has a past medical history of Asthma; Bronchitis; and Pneumonia.    Past Surgical History:  has a past surgical history that includes Appendectomy and Wisdom tooth extraction.    Social History:  reports that he has been smoking Cigars.  He has smoked for the past 1.00 year. He has never used smokeless tobacco. He reports that he does not drink alcohol or use illicit drugs.    Family History: family history is not on file.     The patient???s home medications have been reviewed.    Allergies: Penicillins      ---------------------------------------------------PHYSICAL EXAM--------------------------------------    Constitutional/General: Alert and oriented x3, well appearing, non toxic in NAD  Head: Normocephalic and atraumatic  Eyes: PERRL, EOMI, conjunctiva  normal. Fatigable nystagmus, reproducible with head movement.  Mouth: Oropharynx clear  Neck: Supple, full ROM  Respiratory: Lungs clear to auscultation bilaterally, no wheezes, rales, or rhonchi. Not in respiratory distress  Cardiovascular:  Regular rate. Regular rhythm. No murmurs, gallops, or rubs.  GI:  Abdomen Soft, Non tender, Non distended. +BS. No rebound, guarding, or rigidity.  Musculoskeletal: Moves all extremities x 4. Warm and well perfused  Integument: skin warm and dry. No rashes.   Neurologic: GCS 15  Psychiatric: Normal Affect    -------------------------------------------------- RESULTS -------------------------------------------------  I have personally reviewed all laboratory and imaging results for this patient. Results are listed below.     LABS:  No results found for this visit on 02/14/15.    RADIOLOGY:  Interpreted by Radiologist.  No orders to display       ------------------------- NURSING NOTES AND VITALS REVIEWED ---------------------------   The nursing notes within the ED encounter and vital signs as below have been reviewed by myself.  Visit Vitals   ??? BP (!) 157/87   ??? Pulse 96   ??? Temp 98.6 ??F (37 ??C) (Oral)   ??? Resp 16   ??? Ht 5\' 11"  (1.803 m)   ??? Wt 190 lb (86.2 kg)   ??? SpO2 99%   ??? BMI 26.5 kg/m2     Oxygen Saturation Interpretation: Normal    The patient???s available past medical records and past encounters were reviewed.      ------------------------------ ED COURSE/MEDICAL DECISION MAKING----------------------  Medications   0.9 % sodium chloride bolus (  not administered)   meclizine (ANTIVERT) tablet 25 mg (25 mg Oral Not Given 02/14/15 1624)       Medical Decision Making:   Patient comes to the ED complaining of weakness, dizziness, slight cough, chills, and lower back pain. Orders are IV medications. Results have been received and reviewed. Patient eloped.    This patient's ED course included: a personal history and physicial examination, re-evaluation prior to disposition and  IV medications    This patient has remained hemodynamically stable during their ED course.    The emergency provider has spoken with the patient and family members and discussed today???s results, in addition to providing specific details for the plan of care and counseling regarding the diagnosis and prognosis. Questions are answered at this time and they are agreeable with the plan.    Re-Evaluations:           Re-evaluation. Patient refused to wait for treatment and pain medication shots. LEFT AMA without signing AMA forms.    Consultations:  None    Critical Care:  None    Counseling:   The emergency provider has spoken with the patient and discussed today???s results, in addition to providing specific details for the plan of care and counseling regarding the diagnosis and prognosis.  Questions are answered at this time and they are agreeable with the plan.     --------------------------------- IMPRESSION AND DISPOSITION ---------------------------------    IMPRESSION  1. Benign paroxysmal positional vertigo, unspecified laterality        DISPOSITION  Disposition: Other Disposition: Eloped AMA   Patient condition is stable    02/14/15, 2:20 PM.    This note is prepared by Scheryl DarterKristin LaTessa, acting as Scribe for Romero BellingJoseph Alvah Gilder, MD.    Romero BellingJoseph Rashaun Curl, MD:  The scribe's documentation has been prepared under my direction and personally reviewed by me in its entirety.  I confirm that the note above accurately reflects all work, treatment, procedures, and medical decision making performed by me.     Romero BellingJoseph Carthel Castille, MD  02/14/15 [phone]

## 2015-03-02 ENCOUNTER — Encounter: Payer: PRIVATE HEALTH INSURANCE | Attending: Family Medicine | Primary: Family Medicine

## 2015-03-03 NOTE — Telephone Encounter (Signed)
No show on 1/24 with dr zeigler to estab, lm to resch

## 2018-11-21 ENCOUNTER — Emergency Department (HOSPITAL_COMMUNITY): Payer: Self-pay

## 2018-11-21 ENCOUNTER — Encounter (HOSPITAL_COMMUNITY): Payer: Self-pay

## 2018-11-21 ENCOUNTER — Emergency Department (HOSPITAL_COMMUNITY)
Admission: EM | Admit: 2018-11-21 | Discharge: 2018-11-21 | Disposition: A | Discharge status: Home / Self Care | Payer: Self-pay | Attending: Emergency Medicine | Admitting: Emergency Medicine

## 2018-11-21 ENCOUNTER — Other Ambulatory Visit: Payer: Self-pay

## 2018-11-21 DIAGNOSIS — J40 Bronchitis, not specified as acute or chronic: Secondary | ICD-10-CM | POA: Insufficient documentation

## 2018-11-21 DIAGNOSIS — F17210 Nicotine dependence, cigarettes, uncomplicated: Secondary | ICD-10-CM | POA: Insufficient documentation

## 2018-11-21 MED ORDER — BENZONATATE 100 MG PO CAPS: 100.0000 mg | ORAL_CAPSULE | Freq: Three times a day (TID) | ORAL | 0 refills | Status: AC

## 2018-11-21 MED ORDER — PREDNISONE 20 MG PO TABS: ORAL_TABLET | ORAL | 0 refills | Status: AC

## 2018-11-21 MED ORDER — ALBUTEROL SULFATE HFA 108 (90 BASE) MCG/ACT IN AERS: 2.0000 | INHALATION_SPRAY | RESPIRATORY_TRACT | Status: DC | PRN

## 2018-11-21 NOTE — ED Provider Notes (Signed)
[facility] EMERGENCY DEPARTMENT Provider Note   CSN: [id number] Arrival date & time: 11/21/18  1442     History   Chief Complaint Chief Complaint  Patient presents with  . Cough  . Bronchitis    HPI Jewel Venditto is a 38 y.o. male.     The history is provided by the patient. No language interpreter was used.  Cough    38 year old male with history of tobacco use presenting complaining of a cough.  Patient report for the past 1 to 2 weeks he has had persistent cough usually at nighttime.  Cough mildly productive with clear sputum.  He also endorsed increased wheezing and mild shortness of breath with his cough.  Symptoms felt similar to prior bronchitis.  Admits to having history of asthma in the past.  He usually takes steroid which did help.  No recent steroid use.  States that he has had wheezing, test that was negative.  He endorsed a mild congestion but denies any fever, chills, body aches, loss of taste or smell, or hemoptysis.  No prior history of PE or DVT.  History reviewed. No pertinent past medical history.  There are no active problems to display for this patient.   Past Surgical History:  Procedure Laterality Date  . APPENDECTOMY          Home Medications    Prior to Admission medications   Not on File    Family History History reviewed. No pertinent family history.  Social History Social History   Tobacco Use  . Smoking status: Current Every Day Smoker    Types: Cigarettes  . Smokeless tobacco: Never Used  Substance Use Topics  . Alcohol use: Yes    Comment: social   . Drug use: Never     Allergies   Patient has no known allergies.   Review of Systems Review of Systems  Respiratory: Positive for cough.   All other systems reviewed and are negative.    Physical Exam Updated Vital Signs BP 140/75 (BP Location: Left Arm)   Pulse (!) 106   Temp 98.7 F (37.1 C) (Oral)   Resp 15   SpO2 98%   Physical Exam  Vitals signs and nursing note reviewed.  Constitutional:      General: He is not in acute distress.    Appearance: He is well-developed.  HENT:     Head: Atraumatic.  Eyes:     Conjunctiva/sclera: Conjunctivae normal.  Neck:     Musculoskeletal: Neck supple.  Cardiovascular:     Rate and Rhythm: Normal rate and regular rhythm.     Pulses: Normal pulses.     Heart sounds: Normal heart sounds.  Pulmonary:     Effort: Pulmonary effort is normal.     Breath sounds: Wheezing (Faint expiratory wheezes heard) present.  Abdominal:     Palpations: Abdomen is soft.  Musculoskeletal:        General: No swelling.  Skin:    Findings: No rash.  Neurological:     Mental Status: He is alert and oriented to person, place, and time.  Psychiatric:        Mood and Affect: Mood normal.      ED Treatments / Results  Labs (all labs ordered are listed, but only abnormal results are displayed) Labs Reviewed - No data to display  EKG None  Radiology Dg Chest 2 View  Result Date: 11/21/2018 CLINICAL DATA:  Cough, SOB, chest discomfort at night x2 weeks.  Hx of asthma, bronchitis. Smoker. EXAM: CHEST - 2 VIEW COMPARISON:  None. FINDINGS: Heart size is UPPER limits normal. The lungs are free of focal consolidations and pleural effusions. No pulmonary edema. IMPRESSION: No active cardiopulmonary disease. Electronically Signed   By: Norva Pavlov M.D.   On: 11/21/2018 15:38    Procedures Procedures (including critical care time)  Medications Ordered in ED Medications  albuterol (VENTOLIN HFA) 108 (90 Base) MCG/ACT inhaler 2 puff (has no administration in time range)     Initial Impression / Assessment and Plan / ED Course  I have reviewed the triage vital signs and the nursing notes.  Pertinent labs & imaging results that were available during my care of the patient were reviewed by me and considered in my medical decision making (see chart for details).        BP 140/75 (BP  Location: Left Arm)   Pulse (!) 106   Temp 98.7 F (37.1 C) (Oral)   Resp 15   SpO2 98%    Final Clinical Impressions(s) / ED Diagnoses   Final diagnoses:  Bronchitis    ED Discharge Orders         Ordered    predniSONE (DELTASONE) 20 MG tablet     11/21/18 1633    benzonatate (TESSALON) 100 MG capsule  Every 8 hours     11/21/18 1633         4:28 PM Patient complaining of cough and wheezes, similar to prior bronchitic flare.  He does have some faint expiratory wheezes on exam.  He is otherwise well-appearing.  States tested negative for COVID-19 recently.  I have low suspicion for PE or DVT.  Tachycardia initially, on recheck heart rate was normal.  Will discharge home with steroids, Tussionex, and albuterol inhaler as treatment for bronchitis.  Return precaution discussed.   Fayrene Helper, PA-C 11/21/18 1633    Pricilla Loveless, MD 11/21/18 2141

## 2018-11-21 NOTE — ED Triage Notes (Addendum)
Pt presents with a persistent cough x2 weeks only at night. Reports hx of bronchitis and feels that is "flarring up"  He is here from [place] for business for the next month or so.

## 2018-11-21 NOTE — ED Notes (Signed)
In xray, will be brought to rm 5 when done.

## 2018-11-21 NOTE — ED Notes (Addendum)
This RN reviewed paperwork dc w/ pt. Pt extremely disgruntled that he did not receive prescription for cough syrup as opposed to tessalon pearles. Educated pt that it was provider discretion as to what prescriptions were provided, pt probed further about PA decision making. PA Rona Ravens to bedside to reiterate prescriptions provided. Pt stated "you can just throw it out, I ain't want it". Questioned pt if he wanted prednisone prescription, he stated "that's fine but throw the other stuff out". This RN informed pt prescriptions were all on one sheet and he could fill what he wanted. Pt stated he had the entire interaction recorded and that he would dispute the claim to his insurance company. Encouraged pt to pursue appropriate avenues as he felt necessary. Pt continued to make negative remarks to this RN on leaving and walking out of department.

## 2018-11-21 NOTE — ED Notes (Signed)
Patient verbalizes understanding of discharge instructions. Opportunity for questioning and answers were provided. Armband removed by staff, pt discharged from ED.  

## 2018-11-21 NOTE — Discharge Instructions (Signed)
Please take medications prescribed for your cough related to bronchitis.  Unfortunately I am not able to prescribe tussionex but take Tessalon Perles as cough medication

## 2020-10-08 IMAGING — CR DG CHEST 2V
2 series · 2 of 2 positions shown · non-contrast
Comparison: None.

CLINICAL DATA: Cough, SOB, chest discomfort at night x2 weeks. Hx
of asthma, bronchitis. Smoker.

EXAM:
CHEST - 2 VIEW

[chest pa]
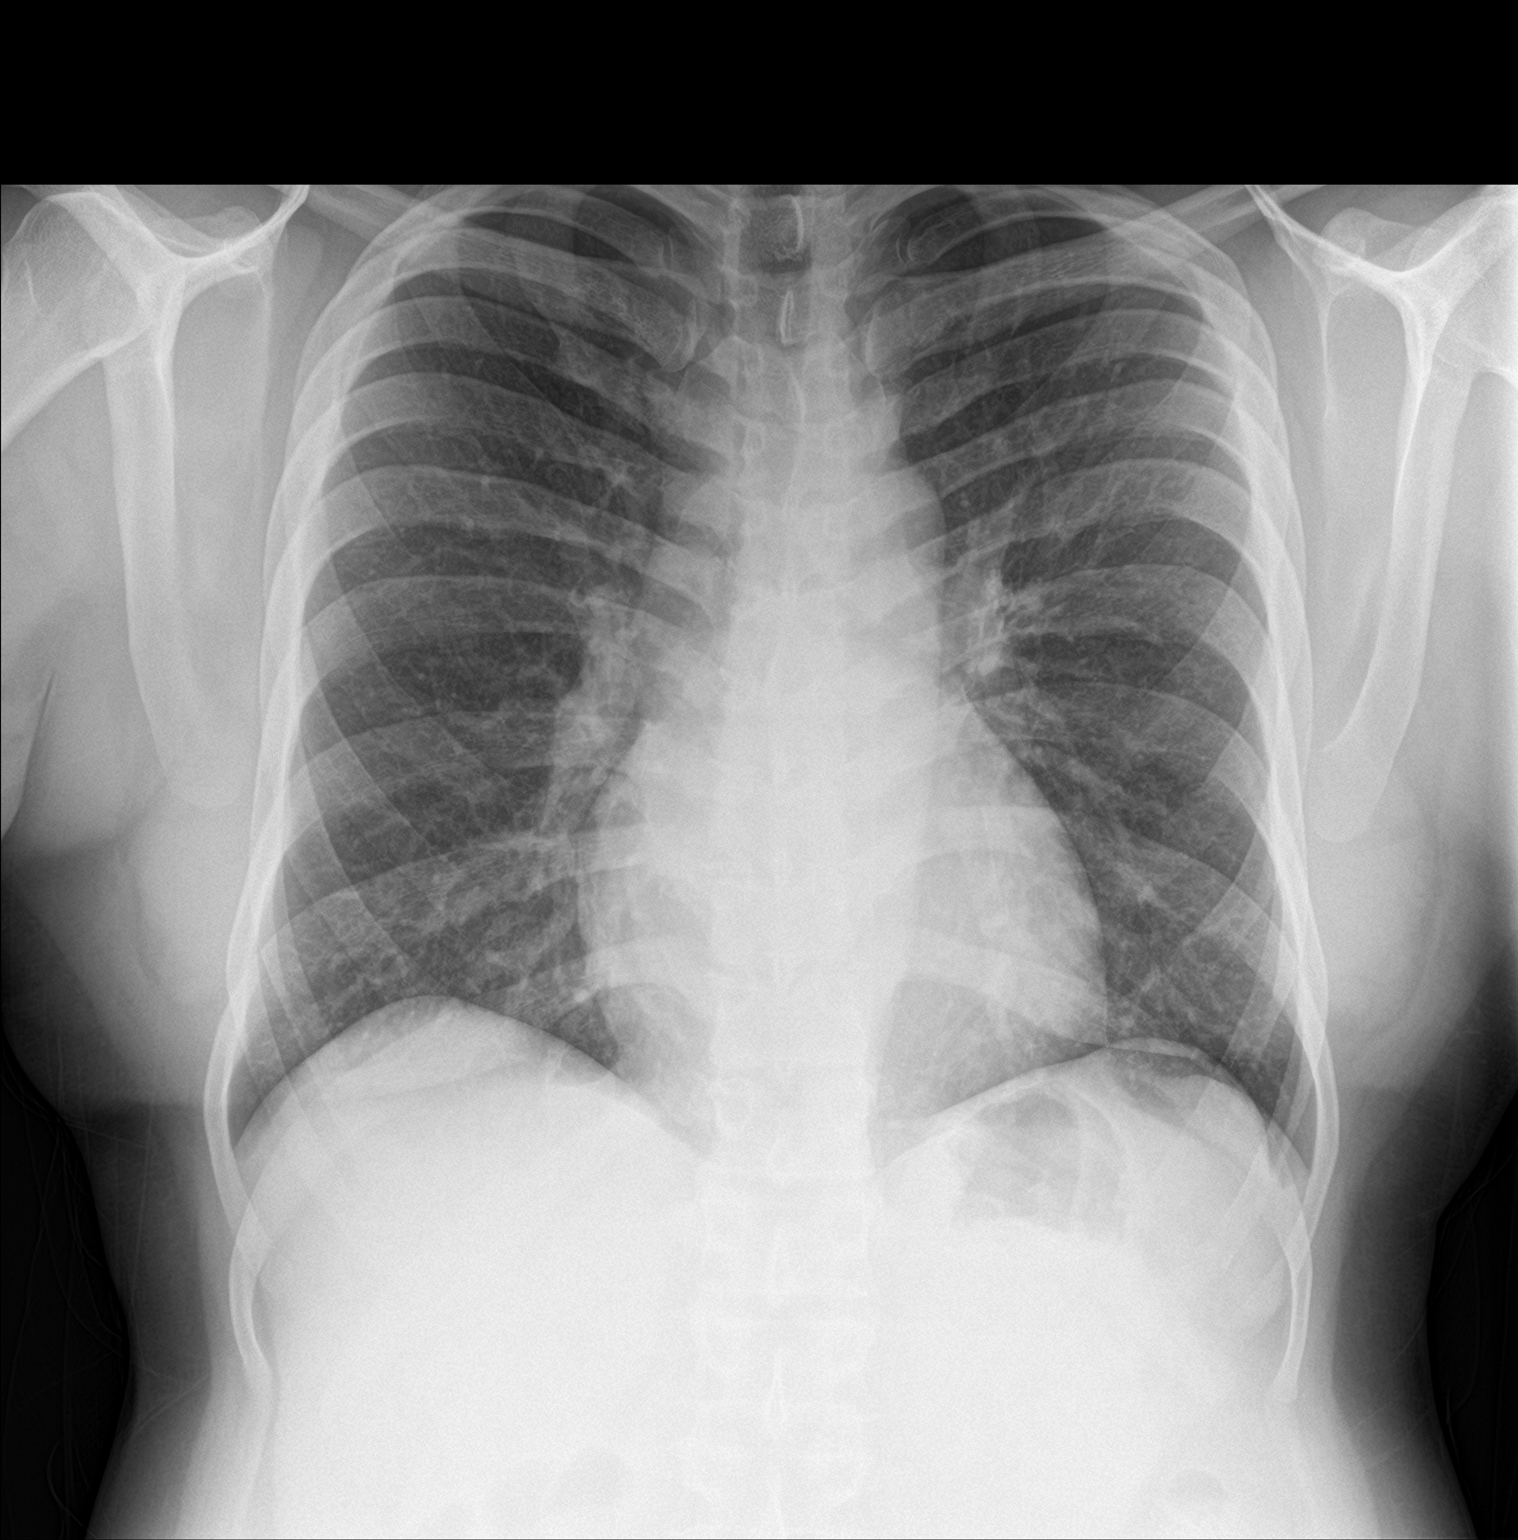

[chest lat]
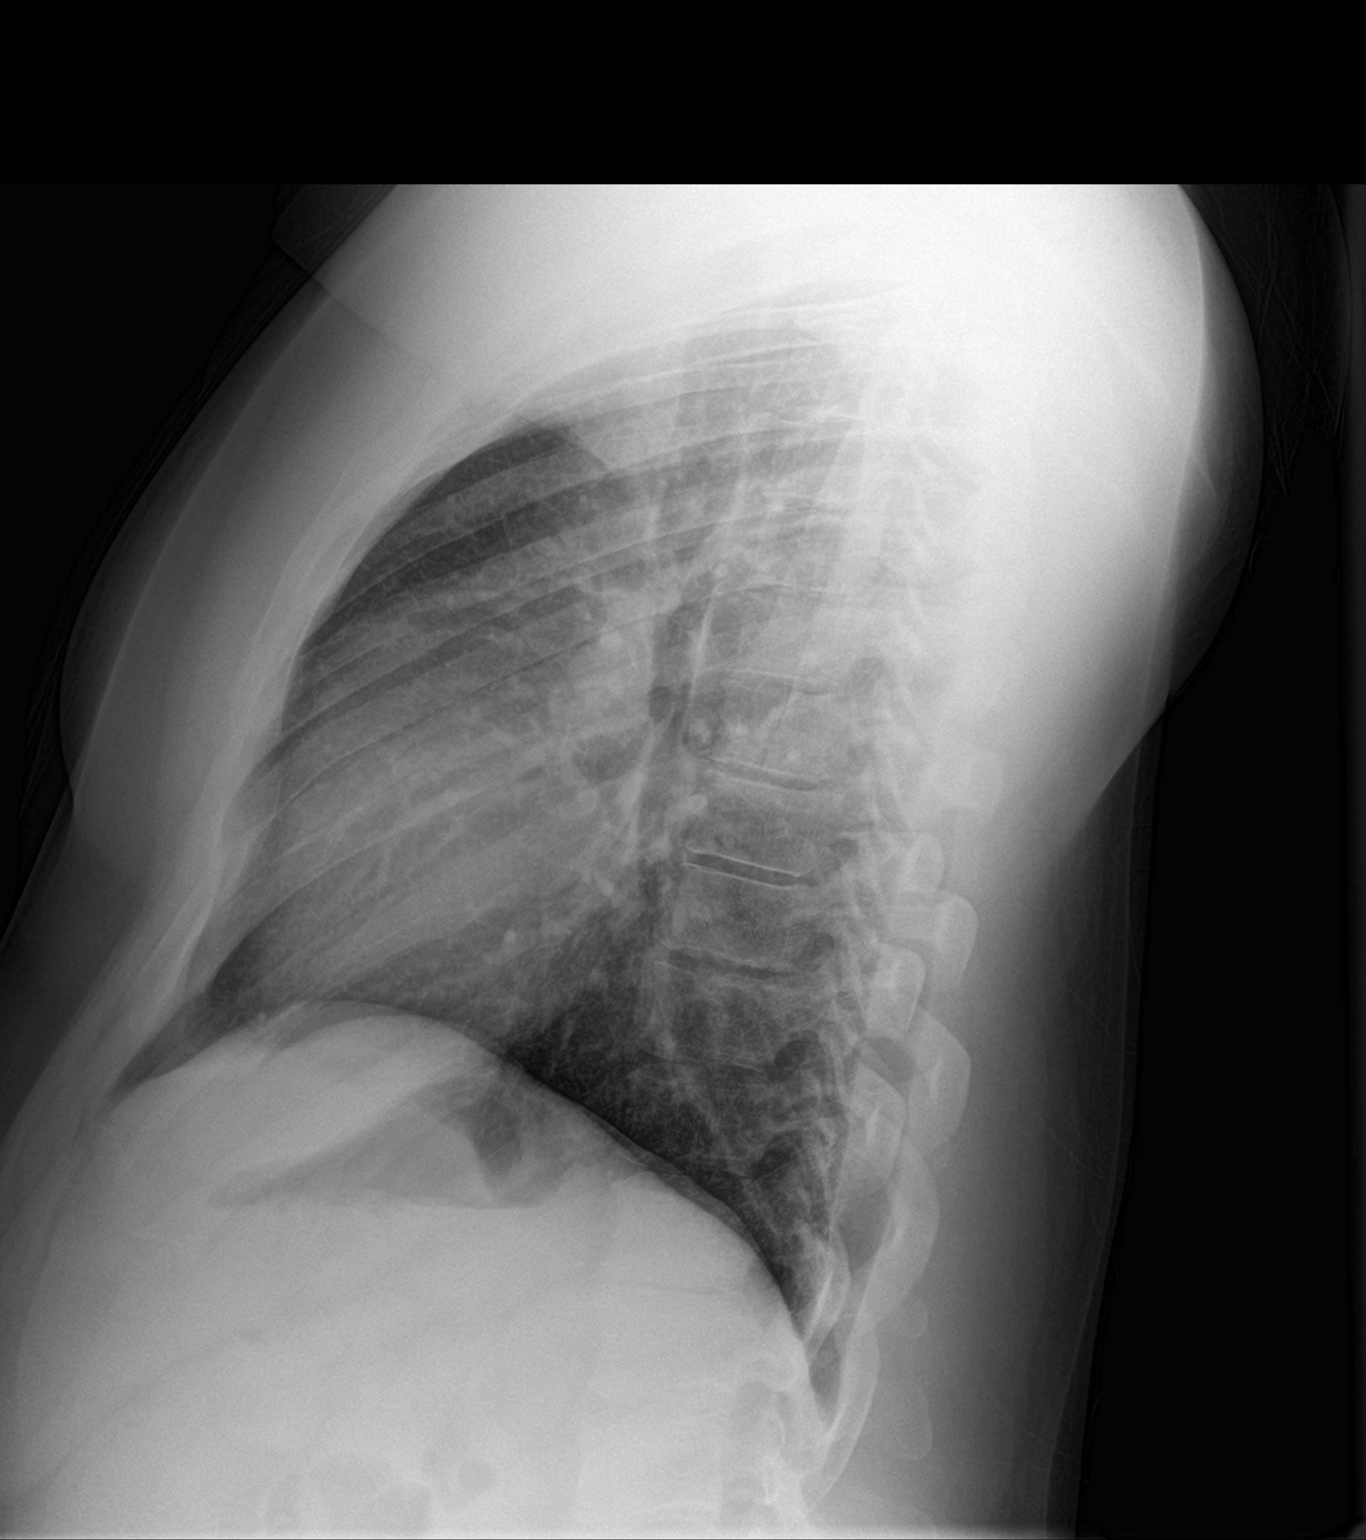

[2 of 2 positions shown; findings below may reference images not displayed]

FINDINGS: Heart size is UPPER limits normal. The lungs are free of focal
consolidations and pleural effusions. No pulmonary edema.
IMPRESSION: No active cardiopulmonary disease.

## 2022-12-02 ENCOUNTER — Inpatient Hospital Stay
Admit: 2022-12-02 | Discharge: 2022-12-02 | Disposition: A | Discharge status: Home / Self Care | Payer: BLUE CROSS/BLUE SHIELD | Attending: Emergency Medicine

## 2022-12-02 DIAGNOSIS — J40 Bronchitis, not specified as acute or chronic: Secondary | ICD-10-CM

## 2022-12-02 LAB — COVID-19 & INFLUENZA COMBO
Influenza A: NOT DETECTED
Influenza B: NOT DETECTED
SARS-CoV-2 RNA, RT PCR: NOT DETECTED

## 2022-12-02 LAB — RSV DETECTION: RSV by PCR: NOT DETECTED

## 2022-12-02 MED ORDER — ALBUTEROL SULFATE HFA 108 (90 BASE) MCG/ACT IN AERS
108 | RESPIRATORY_TRACT | 0 refills | Status: AC | PRN
Start: 2022-12-02 — End: ?

## 2022-12-02 MED ORDER — HYDROCODONE BIT-HOMATROP MBR 5-1.5 MG/5ML PO SOLN
Freq: Four times a day (QID) | ORAL | 0 refills | Status: AC | PRN
Start: 2022-12-02 — End: 2022-12-05

## 2022-12-02 MED ORDER — PREDNISONE 20 MG PO TABS
20 | ORAL_TABLET | Freq: Two times a day (BID) | ORAL | 0 refills | Status: DC
Start: 2022-12-02 — End: 2022-12-06

## 2022-12-02 NOTE — ED Provider Notes (Signed)
[facility] EMERGENCY DEPARTMENT  EMERGENCY DEPARTMENT ENCOUNTER        Pt Name: Todd Dean  MRN: [id number]  Birthdate 21-Feb-1980  Date of evaluation: 12/02/2022  Provider: Broadus John, DO  PCP: Jacelyn Pi, DO  Note Started: 11:22 AM EDT 12/02/22    CHIEF COMPLAINT       Chief Complaint   Patient presents with    Cough     Having a flair up bronchitis, has had a cough for about 1 week, wheezing and congestion       HISTORY OF PRESENT ILLNESS: 1 or more Elements   History From: patient    Limitations to history : None    Todd Dean is a 42 y.o. male who presents with cough and wheezing beginning 1 week ago.  Patient reports this is consistent with a bronchitis flareup and gets this quite frequently.  He used to have a pulmonologist when he lived in the [place] who would give him hydrocodone cough syrup.  Patient presents to the emergency department requesting just this, states he gets 120 mL every time he flares up once a month but has not been seen for 2 years since he moved here to [place].  The complaint has been persistent, moderate in severity, and worsened by nothing.  Patient denies fever/chills, sore throat, chest pain, shortness of breath, edema, headache, visual disturbances, focal paresthesias, focal weakness, abdominal pain, nausea, vomiting, diarrhea, constipation, dysuria, hematuria, trauma, neck or back pain, rash or other complaints.  He denies any recent exposures, travel, surgeries or antibiotics.  He does smoke cigars            Nursing Notes were all reviewed and agreed with or any disagreements were addressed in the HPI.    REVIEW OF SYSTEMS :           Positives and Pertinent negatives as per HPI.     SURGICAL HISTORY     Past Surgical History:   Procedure Laterality Date    APPENDECTOMY      WISDOM TOOTH EXTRACTION         CURRENTMEDICATIONS       Previous Medications    IBUPROFEN (ADVIL;MOTRIN) 600 MG TABLET    Take 1 tablet by mouth every 8  hours as needed for Pain Take with full glass of water    TRAMADOL (ULTRAM) 50 MG TABLET    Take 50 mg by mouth every 6 hours as needed for Pain       ALLERGIES     Penicillins    FAMILYHISTORY     History reviewed. No pertinent family history.     SOCIAL HISTORY       Social History     Tobacco Use    Smoking status: Every Day     Types: Cigars    Smokeless tobacco: Never   Substance Use Topics    Alcohol use: No     Comment: social - 2x / month    Drug use: No     Comment: 1-2 cigars a day        SCREENINGS        Glasgow Coma Scale  Eye Opening: Spontaneous  Best Verbal Response: Oriented  Best Motor Response: Obeys commands  Glasgow Coma Scale Score: 15                CIWA Assessment  BP: (!) 148/91  Pulse: 91  PHYSICAL EXAM  1 or more Elements     ED Triage Vitals   BP Systolic BP Percentile Diastolic BP Percentile Temp Temp Source Pulse Respirations SpO2   12/02/22 1033 -- -- 12/02/22 1033 12/02/22 1033 12/02/22 1033 12/02/22 1033 12/02/22 1033   (!) 148/91   98.5 F (36.9 C) Oral 91 18 96 %      Height Weight - Scale         12/02/22 1033 12/02/22 1051         1.803 m (5\' 11" ) 90.7 kg (200 lb)                ---------------------------------------PHYSICAL EXAM--------------------------------------  Constitutional:  Well developed, well nourished, no acute distress, non-toxic appearance   Eyes:  PERRL, conjunctiva normal, EOMI  HENT:  Atraumatic, external ears normal, nose normal, oropharynx moist, no pharyngeal exudates, redness or swelling.  TMs clear and intact bilaterally. Neck- normal range of motion, no nuchal rigidity   Respiratory:  No respiratory distress, normal breath sounds, no rales, no wheezing.  Intermittent dry cough.  Cardiovascular:  Normal rate, normal rhythm, no murmurs, no gallops, no rubs. Radial pulses 2+ bilaterally.   GI:  Soft, nondistended, normal bowel sounds, nontender,  no rebound, no guarding   GU:  No costovertebral angle tenderness   Musculoskeletal:  No edema, no  tenderness, no deformities. Back- no tenderness  Integument:  Well hydrated, no rash. Adequate perfusion.   Lymphatic:  No cervical lymphadenopathy noted   Neurologic:  Alert & oriented x 3, CN 2-12 normal, no focal deficits noted.  Normal gait.    Psychiatric:  Speech and behavior appropriate         DIAGNOSTIC RESULTS   LABS:  Results for orders placed or performed during the hospital encounter of 12/02/22   COVID-19 & Influenza Combo    Specimen: Nasopharyngeal Swab   Result Value Ref Range    Specimen Description .NASOPHARYNGEAL SWAB     Source .NASOPHARYNGEAL SWAB     SARS-CoV-2 RNA, RT PCR Not Detected Not Detected    Influenza A Not Detected Not Detected    Influenza B Not Detected Not Detected   RSV Detection    Specimen: Nasopharyngeal Swab   Result Value Ref Range    Source .NASOPHARYNGEAL SWAB     RSV by PCR Not Detected Not Detected       As interpreted by me, the above displayed labs are abnormal as marked by (H) or (L). All other labs obtained during this visit were within normal range or not returned as of this dictation.        RADIOLOGY:   Non-plain film images such as CT, Ultrasound and MRI are read by the radiologist. Plain radiographic images are visualized and preliminarily interpreted by the ED Provider with the below findings:    Interpretation per the Radiologist below, if available at the time of this note:    No orders to display     No results found.    No results found.    PROCEDURES   none          CRITICAL CARE TIME (.cct)   none    PAST MEDICAL HISTORY/Chronic Conditions Affecting Care      has a past medical history of Asthma, Bronchitis, and Pneumonia.     EMERGENCY DEPARTMENT COURSE    Vitals:    Vitals:    12/02/22 1033 12/02/22 1051   BP: (!) 148/91    Pulse: 91  Resp: 18    Temp: 98.5 F (36.9 C)    TempSrc: Oral    SpO2: 96%    Weight:  90.7 kg (200 lb)   Height: 1.803 m (5\' 11" )        Patient was given the following medications:  Medications - No data to display        Is  this patient to be included in the SEP-1 Core Measure due to severe sepsis or septic shock?   No Exclusion criteria - the patient is NOT to be included for SEP-1 Core Measure due to: Infection is not suspected        Medical Decision Making/Differential Diagnosis:    CC/HPI Summary, Social Determinants of health, Records Reviewed, DDx, testing done/not done, ED Course, Reassessment, disposition considerations/shared decision making with patient, consults, disposition:            MDM:   Viral swabs negative.  Patient declined x-ray.  He will except an albuterol inhaler and prednisone, but insistent on the Hycodan cough syrup.  He was given a 3-day supply to use judiciously.  Safe opiate use and driving restrictions reviewed.  Follow-up with pulmonology and PCP    Patient appears well, nontoxic, neurovascularly intact and ambulatory upon discharge.  Patient was explicitly instructed on specific signs and symptoms on which to return to the emergency room for. Patient was instructed to return to the ER for any new or worsening symptoms. Additional discharge instructions were given verbally. All questions were answered. Patient is comfortable and agreeable with discharge plan. Patient in no acute distress and non-toxic in appearance.     Shared decision making was used to determine testing, treatments and disposition.      Counseling:  The emergency provider has spoken with the patient and spouse/SO and discussed today's results, in addition to providing specific details for the plan of care and counseling regarding the diagnosis and prognosis.  Questions are answered at this time and they are agreeable with the plan.    I am the Primary Clinician of Record.     --------------------------------- IMPRESSION AND DISPOSITION ---------------------------------    IMPRESSION  1. Bronchitis        DISPOSITION  Disposition: Discharge to home  Patient condition is stable    PATIENT REFERRED TO:  Minotti, Doree Albee, DO  [address]  [address]  [place] [address]  [phone]    Call today      Mingo Amber, DO  [address]  [address] [place] [address]  [phone]    Call in 1 day  pulmonologist    [facility] Emergency Department  [address]  [place] [address]  [phone]  Go to   If symptoms worsen      DISCHARGE MEDICATIONS:  New Prescriptions    ALBUTEROL SULFATE HFA (VENTOLIN HFA) 108 (90 BASE) MCG/ACT INHALER    Inhale 2 puffs into the lungs every 4 hours as needed for Wheezing    HYDROCODONE HOMATROPINE (HYCODAN) 5-1.5 MG/5ML SOLUTION    Take 5 mLs by mouth every 6 hours as needed (cough) for up to 3 days. Max Daily Amount: 20 mLs    PREDNISONE (DELTASONE) 20 MG TABLET    Take 1 tablet by mouth 2 times daily for 5 days       DISCONTINUED MEDICATIONS:  Discontinued Medications    No medications on file         Periodic Controlled  Substance Monitoring: Possible medication side effects, risk of tolerance/dependence & alternative treatments discussed., No signs of potential drug abuse or diversion identified. Marlene Bast, Oda Kilts, DO)    (Please note that portions of this note were completed with a voice recognition program.  Efforts were made to edit the dictations but occasionally words are mis-transcribed.)    Broadus John, DO (electronically signed)           Broadus John, DO  12/02/22 1214

## 2022-12-02 NOTE — Discharge Instructions (Addendum)
Driving and Equipment Restrictions  Some medical problems make it dangerous to drive, ride a bike, or use machines. Some of these problems are:  A hard blow to the head (concussion).  Passing out (fainting).  Twitching and shaking (seizures).  Low blood sugar.  Taking medicine to help you relax (sedatives).  Taking pain medicines.  Wearing an eye patch.  Wearing splints. This can make it hard to use parts of your body that you need to drive safely.  HOME CARE   Do not drive until your doctor says it is okay.  Do not use machines until your doctor says it is okay.  You may need a form signed by your doctor (medical release) before you can drive again. You may also need this form before you do other tasks where you need to be fully alert.  MAKE SURE YOU:  Understand these instructions.  Will watch your condition.  Will get help right away if you are not doing well or get worse.

## 2022-12-05 ENCOUNTER — Encounter: Payer: BLUE CROSS/BLUE SHIELD | Attending: Family Medicine | Primary: Family Medicine

## 2022-12-05 NOTE — Telephone Encounter (Signed)
Wants to establish as a new patient-will you accept?

## 2022-12-06 ENCOUNTER — Ambulatory Visit: Admit: 2022-12-06 | Discharge: 2022-12-06 | Payer: BLUE CROSS/BLUE SHIELD | Attending: Physician Assistant

## 2022-12-06 DIAGNOSIS — J069 Acute upper respiratory infection, unspecified: Secondary | ICD-10-CM

## 2022-12-06 MED ORDER — PREDNISONE 10 MG PO TABS
10 | ORAL_TABLET | ORAL | 0 refills | Status: DC
Start: 2022-12-06 — End: 2023-01-19

## 2022-12-06 MED ORDER — AZITHROMYCIN 250 MG PO TABS
250 | ORAL_TABLET | ORAL | 0 refills | Status: AC
Start: 2022-12-06 — End: 2022-12-16

## 2022-12-06 MED ORDER — PROMETHAZINE-DM 6.25-15 MG/5ML PO SYRP
Freq: Four times a day (QID) | ORAL | 0 refills | Status: AC | PRN
Start: 2022-12-06 — End: 2022-12-13

## 2022-12-06 NOTE — Progress Notes (Signed)
12/06/22  Todd Dean DOB: 24-May-1980 Sex: male  Age 42 y.o.      Subjective:  Chief Complaint   Patient presents with    Cough     Concern for bronchitis    Drainage         HPI:   Cough  This is a recurrent problem. The current episode started 1 to 4 weeks ago. The problem has been unchanged. The cough is Productive of sputum. Associated symptoms include wheezing. Pertinent negatives include no chest pain or sweats. The symptoms are aggravated by pollens. Risk factors for lung disease include smoking/tobacco exposure. He has tried oral steroids (Given inhaler but did not get it filled) for the symptoms. His past medical history is significant for asthma and bronchitis.     Patient was seen and evaluated in the emergency department.  The patient was given inhaler, prednisone and cough medication.    The patient did not get the inhaler filled.    The patient states that he recently lives in [place] for 6 years.  The patient has been here in [place] for the last couple of years.           Chief Complaint   Patient presents with    Cough       Having a flair up bronchitis, has had a cough for about 1 week, wheezing and congestion         HISTORY OF PRESENT ILLNESS: 1 or more Elements   History From: patient     Limitations to history : None     Todd Dean is a 42 y.o. male who presents with cough and wheezing beginning 1 week ago.  Patient reports this is consistent with a bronchitis flareup and gets this quite frequently.  He used to have a pulmonologist when he lived in the [place] who would give him hydrocodone cough syrup.  Patient presents to the emergency department requesting just this, states he gets 120 mL every time he flares up once a month but has not been seen for 2 years since he moved here to [place].  The complaint has been persistent, moderate in severity, and worsened by nothing.  Patient denies fever/chills, sore throat, chest pain, shortness of breath, edema, headache, visual  disturbances, focal paresthesias, focal weakness, abdominal pain, nausea, vomiting, diarrhea, constipation, dysuria, hematuria, trauma, neck or back pain, rash or other complaints.  He denies any recent exposures, travel, surgeries or antibiotics.  He does smoke cigars     LABS:        Results for orders placed or performed during the hospital encounter of 12/02/22   COVID-19 & Influenza Combo     Specimen: Nasopharyngeal Swab   Result Value Ref Range     Specimen Description .NASOPHARYNGEAL SWAB       Source .NASOPHARYNGEAL SWAB       SARS-CoV-2 RNA, RT PCR Not Detected Not Detected     Influenza A Not Detected Not Detected     Influenza B Not Detected Not Detected   RSV Detection     Specimen: Nasopharyngeal Swab   Result Value Ref Range     Source .NASOPHARYNGEAL SWAB       RSV by PCR Not Detected Not Detected         As interpreted by me, the above displayed labs are abnormal as marked by (H) or (L). All other labs obtained during this visit were within normal range or not returned as of this dictation.  IMPRESSION  1. Bronchitis          DISPOSITION  Disposition: Discharge to home  Patient condition is stable     DISCHARGE MEDICATIONS:       New Prescriptions     ALBUTEROL SULFATE HFA (VENTOLIN HFA) 108 (90 BASE) MCG/ACT INHALER    Inhale 2 puffs into the lungs every 4 hours as needed for Wheezing     HYDROCODONE HOMATROPINE (HYCODAN) 5-1.5 MG/5ML SOLUTION    Take 5 mLs by mouth every 6 hours as needed (cough) for up to 3 days. Max Daily Amount: 20 mLs     PREDNISONE (DELTASONE) 20 MG TABLET    Take 1 tablet by mouth 2 times daily for 5 days           ROS:   Unless otherwise stated in this report the patient's positive and negative responses for review of systems for constitutional, eyes, ENT, cardiovascular, respiratory, gastrointestinal, neurological, GU, musculoskeletal, and integument systems and related systems to the presenting problem are either stated in the history of present illness or were not  pertinent or were negative for the symptoms and/or complaints related to the presenting medical problem.  Positives and pertinent negatives as per HPI.  All others reviewed and are negative.      PMH:     Past Medical History:   Diagnosis Date    Asthma     Bronchitis     Pneumonia        Past Surgical History:   Procedure Laterality Date    APPENDECTOMY      WISDOM TOOTH EXTRACTION         History reviewed. No pertinent family history.    Medications:     Current Outpatient Medications:     azithromycin (ZITHROMAX) 250 MG tablet, 500mg  on day 1 followed by 250mg  on days 2 - 5, Disp: 6 tablet, Rfl: 0    predniSONE (DELTASONE) 10 MG tablet, 3 tabs once daily for 3 days, 2 tabs once daily for 3 days, 1 tab once daily for 3 days, Disp: 18 tablet, Rfl: 0    promethazine-dextromethorphan (PROMETHAZINE-DM) 6.25-15 MG/5ML syrup, Take 5 mLs by mouth 4 times daily as needed for Cough, Disp: 120 mL, Rfl: 0    albuterol sulfate HFA (VENTOLIN HFA) 108 (90 Base) MCG/ACT inhaler, Inhale 2 puffs into the lungs every 4 hours as needed for Wheezing, Disp: 18 g, Rfl: 0    traMADol (ULTRAM) 50 MG tablet, Take 50 mg by mouth every 6 hours as needed for Pain (Patient not taking: Reported on 12/02/2022), Disp: , Rfl:     Allergies:     Allergies   Allergen Reactions    Penicillins        Social History:     Social History     Tobacco Use    Smoking status: Every Day     Types: Cigars    Smokeless tobacco: Never   Substance Use Topics    Alcohol use: No     Comment: social - 2x / month    Drug use: No     Comment: 1-2 cigars a day        Patient lives at home.    Physical Exam:     Vitals:    12/06/22 1439   BP: (!) 150/82   Site: Right Upper Arm   Position: Sitting   Pulse: 84   Temp: 98.4 F (36.9 C)   TempSrc: Temporal   SpO2: 97%  Weight: 92.5 kg (204 lb)       Exam:  Physical Exam  Nurse's notes and vital signs reviewed. The patient is not hypoxic.  ?  General: Alert, no acute distress, patient resting comfortably Patient is not  toxic or lethargic.  Skin: Warm, intact, no pallor noted. There is no evidence of rash at this time.  Head: Normocephalic, atraumatic  Eye: Normal conjunctiva  Ears, Nose, Throat: Right tympanic membrane clear, left tympanic membrane clear. No drainage or discharge noted. No pre- or post-auricular tenderness, erythema, or swelling noted.   No rhinorrhea or congestion noted.   Posterior oropharynx shows no erythema, tonsillar hypertrophy, or exudate. the uvula is midline. No trismus or drooling is noted.   Moist mucous membranes.  Cardiovascular: Regular Rate and Rhythm  Respiratory: No acute distress, diminished lung sounds but no rhonchi, wheezing or crackles noted. No stridor or retractions are noted.  Neurological: A&O x4, normal speech  Psychiatric: Cooperative       Testing:     No results found for this visit on 12/06/22.      Component  Ref Range & Units    Source .NASOPHARYNGEAL SWAB   RSV by PCR  Not Detected Not Detected   Resulting Agency [facility]              Specimen Collected: 12/02/22 11:15 EDT Last Resulted: 12/02/22 11:42 EDT        Component  Ref Range & Units    Specimen Description .NASOPHARYNGEAL SWAB   Source .NASOPHARYNGEAL SWAB   SARS-CoV-2 RNA, RT PCR  Not Detected Not Detected   Comment:       Testing was performed using [employer] SARS-CoV-2 and Influenza A/B nucleic acid assay.  This test is a multiplex Real-Time Reverse Transcriptase Polymerase Chain Reaction  (RT-PCR)-based in vitro diagnostic test intended for the qualitative detection of nucleic  acids from SARS-CoV-2,  influenza A, and influenza B in nasopharyngeal and nasal swab specimens for use under the  FDA's Emergency Use Authorization (EUA) only.       Not Detected results do not preclude SARS-CoV-2 infection and should not be used as the sole   basis for patient management decisions.  Negative results must be combined with clinical observations, patient history, and  epidemiological  information.       Fact sheet for Patients: SalonClasses.at  Fact sheet for Healthcare Providers: CoursePreviews.dk   Influenza A  Not Detected Not Detected   Influenza B  Not Detected Not Detected   Resulting Agency [facility]              Specimen Collected: 12/02/22 11:15 EDT Last Resulted: 12/02/22 11:46 EDT          Medical Decision Making:     Vital signs reviewed    Past medical history reviewed.    Allergies reviewed.    Medications reviewed.    Patient on arrival does not appear to be in any apparent distress or discomfort.  The patient has been seen and evaluated.  The patient does not appear to be toxic or lethargic.     Patient refused x-ray    The patient was asking for further narcotics of cough medication.    Per policy we will not be dispensing narcotic medication.    OARRS was reviewed    The patient will be given azithromycin, prednisone, Promethazine DM    The patient is to return to  express care or go directly to the emergency department should any of the signs or symptoms worsen. The patient is to followup with primary care physician in 2-3 days for repeat evaluation. The patient has no other questions or concerns at this time the patient will be discharged home.      Clinical Impression:   Todd Dean was seen today for cough and drainage.    Diagnoses and all orders for this visit:    Acute upper respiratory infection, unspecified    Chronic bronchitis with acute exacerbation (HCC)    Cough, unspecified type    Other orders  -     azithromycin (ZITHROMAX) 250 MG tablet; 500mg  on day 1 followed by 250mg  on days 2 - 5  -     predniSONE (DELTASONE) 10 MG tablet; 3 tabs once daily for 3 days, 2 tabs once daily for 3 days, 1 tab once daily for 3 days  -     promethazine-dextromethorphan (PROMETHAZINE-DM) 6.25-15 MG/5ML syrup; Take 5 mLs by mouth 4 times daily as needed for Cough        The patient is to call for any  concerns or return if any of the signs or symptoms worsen. The patient is to follow-up with PCP in the next 2-3 days for repeat evaluation repeat assessment or go directly to the emergency department.     SIGNATURE: Alben Spittle III, PA-C    This document may have been prepared at least partially through the use of voice recognition software. Although effort is taken to assure the accuracy ofthis document, it is possible that grammatical, syntax, or spelling errors may occur.

## 2022-12-06 NOTE — Telephone Encounter (Signed)
Unable to except

## 2022-12-06 NOTE — Telephone Encounter (Signed)
Left message for patient

## 2022-12-06 NOTE — Telephone Encounter (Signed)
Unable to accept.

## 2022-12-06 NOTE — Telephone Encounter (Signed)
Unable to accept at this time 

## 2022-12-06 NOTE — Telephone Encounter (Signed)
Wants to establish as a new patient-will you accept?

## 2022-12-06 NOTE — Telephone Encounter (Signed)
Unable at this time. Standard [facility] NP policies

## 2022-12-07 NOTE — Telephone Encounter (Signed)
Patient notified that no doctors here are accepting new patients.

## 2022-12-07 NOTE — Telephone Encounter (Signed)
Left message for patient

## 2023-01-19 ENCOUNTER — Ambulatory Visit
Admit: 2023-01-19 | Discharge: 2023-01-19 | Payer: BLUE CROSS/BLUE SHIELD | Attending: Family Medicine | Primary: Family Medicine

## 2023-01-19 VITALS — BP 127/86 | HR 84 | Temp 98.20000°F | Resp 16 | Ht 71.0 in | Wt 212.0 lb

## 2023-01-19 DIAGNOSIS — M545 Low back pain, unspecified: Secondary | ICD-10-CM

## 2023-01-19 LAB — POCT GLYCOSYLATED HEMOGLOBIN (HGB A1C): Hemoglobin A1C: 6.2 %

## 2023-01-19 MED ORDER — NAPROXEN 500 MG PO TABS
500 | ORAL_TABLET | Freq: Two times a day (BID) | ORAL | 0 refills | Status: DC
Start: 2023-01-19 — End: 2023-08-10

## 2023-01-19 NOTE — Patient Instructions (Signed)
 Check out Dr. Alvino Chapel on YouTube for lumbar stretching exercises.

## 2023-01-19 NOTE — Progress Notes (Signed)
 Chief Complaint:   Todd Dean is a 42 y.o. 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     Twice a year gets bronchitis flare. Prednisone and cough syrup help.

## 2023-08-10 ENCOUNTER — Inpatient Hospital Stay
Admit: 2023-08-10 | Discharge: 2023-08-10 | Disposition: A | Discharge status: Home / Self Care | Payer: BLUE CROSS/BLUE SHIELD | Arrived: VH | Attending: Emergency Medicine

## 2023-08-10 DIAGNOSIS — R300 Dysuria: Principal | ICD-10-CM

## 2023-08-10 LAB — URINALYSIS WITH MICROSCOPIC
Bilirubin, Urine: NEGATIVE
Glucose, Ur: NEGATIVE mg/dL
Leukocyte Esterase, Urine: NEGATIVE
Nitrite, Urine: NEGATIVE
Protein, UA: NEGATIVE mg/dL
RBC, UA: 3 /HPF — AB
Specific Gravity, UA: 1.025 (ref 1.005–1.030)
Urobilinogen, Urine: 0.2 EU/dL (ref 0.0–1.0)
WBC, UA: 0 /HPF
pH, Urine: 6 (ref 5.0–8.0)

## 2023-08-10 MED ORDER — LIDOCAINE HCL 1% INJ (MIXTURES ONLY)
1 | Freq: Once | INTRAMUSCULAR | Status: AC
Start: 2023-08-10 — End: 2023-08-10
  Administered 2023-08-10: 13:00:00 500 mg via INTRAMUSCULAR

## 2023-08-10 MED ORDER — DOXYCYCLINE HYCLATE 100 MG PO CAPS
100 | Freq: Once | ORAL | Status: AC
Start: 2023-08-10 — End: 2023-08-10
  Administered 2023-08-10: 13:00:00 100 mg via ORAL

## 2023-08-10 MED ORDER — DOXYCYCLINE HYCLATE 100 MG PO TABS
100 | ORAL_TABLET | Freq: Two times a day (BID) | ORAL | 0 refills | Status: AC
Start: 2023-08-10 — End: 2023-08-20

## 2023-08-10 MED FILL — CEFTRIAXONE SODIUM 1 G IJ SOLR: 1 g | INTRAMUSCULAR | Qty: 1000

## 2023-08-10 MED FILL — DOXYCYCLINE HYCLATE 100 MG PO CAPS: 100 mg | ORAL | Qty: 1

## 2023-08-10 NOTE — ED Provider Notes (Signed)
 Todd Dean is a 43 year old male present emerged part with concern for dysuria.  Patient had reported that he recently had unprotected sex with a new partner.  Patient denies fever, chills, nausea, vomiting.  Patient does not have any discharge does not have any pain in the abdomen, testicles,    The history is provided by the patient and medical records.        Review of Systems   Constitutional:  Negative for chills, diaphoresis, fatigue and fever.   Eyes:  Negative for photophobia and visual disturbance.   Respiratory:  Negative for cough, chest tightness and shortness of breath.    Cardiovascular:  Negative for chest pain, palpitations and leg swelling.   Gastrointestinal:  Negative for abdominal distention, abdominal pain, diarrhea, nausea and vomiting.   Genitourinary:  Positive for dysuria. Negative for difficulty urinating, flank pain, frequency, genital sores, hematuria, penile swelling, scrotal swelling and testicular pain.   Musculoskeletal:  Negative for back pain, neck pain and neck stiffness.   Skin:  Negative for pallor and rash.   Neurological:  Negative for headaches.   Psychiatric/Behavioral:  Negative for confusion.         Physical Exam  Vitals and nursing note reviewed.   Constitutional:       General: He is not in acute distress.     Appearance: Normal appearance. He is not ill-appearing.   HENT:      Head: Normocephalic and atraumatic.   Eyes:      General: No scleral icterus.     Conjunctiva/sclera: Conjunctivae normal.      Pupils: Pupils are equal, round, and reactive to light.   Cardiovascular:      Rate and Rhythm: Normal rate and regular rhythm.   Pulmonary:      Effort: Pulmonary effort is normal.      Breath sounds: Normal breath sounds.   Abdominal:      General: Bowel sounds are normal. There is no distension.      Palpations: Abdomen is soft.      Tenderness: There is no abdominal tenderness. There is no guarding or rebound.   Musculoskeletal:      Cervical back: Normal range  of motion and neck supple. No rigidity. No muscular tenderness.      Right lower leg: No edema.      Left lower leg: No edema.   Skin:     General: Skin is warm and dry.      Capillary Refill: Capillary refill takes less than 2 seconds.      Coloration: Skin is not pale.      Findings: No erythema or rash.   Neurological:      Mental Status: He is alert and oriented to person, place, and time.   Psychiatric:         Mood and Affect: Mood normal.          Procedures     MDM  Number of Diagnoses or Management Options  Dysuria  Hematuria, unspecified type  Possible exposure to STD  Diagnosis management comments: Todd Dean is a 43 year old male present Emergency Department with concern for pain with urination patient had unprotected sex recently  Vital signs reviewed interpreted patient was afebrile nontachycardic not hypoxic  Labwork was reviewed and interpreted UA was unremarkable for urinary tract infection  Patient would like treatment for GC.  Patient was given IM Rocephin  and p.o. doxycycline .  Patient be discharged home on doxycycline  and GC is pending patient was  advised on indications return to emergency department.  Advised to avoid sexual intercourse until he has results and if are positive he will need to avoid sexual intercourse until completely treated advised to call primary care physician arrange follow-up appointment and return to ED if symptoms worsen he is agreeable to plan and well-appearing at time of discharge not in acute distress    History provided by patient  Comorbidities include chronic back pain, asthma  Differential diagnosis includes not limited to STI, UTI, cystitis, dysuria, hematuria,  Social determinants of health include stress         ED Course as of 08/10/23 0859   Fri Aug 10, 2023   0839 Patient reported that he had penicillin as a child and had a slight rash.  He did have amoxicillin in 2011 and when asked he did not have any difficulties tolerate the medication he has never  had anaphylaxis [SS]      ED Course User Index  [SS] Wynn Monita SAUNDERS, MD       SEP-1 CORE MEASURE DATA      Sepsis Criteria   Severe Sepsis Criteria   Septic Shock Criteria     Must be confirmed or suspected to move forward with diagnosis of sepsis.    Must meet 2:    []  Temperature > 100.9 F (38.3 C)        or < 96.8 F (36 C)  []  HR > 90  []  RR > 20  []  WBC > 12 or < 4 or 10% bands    AND:    []  Infection Confirmed or        Suspected.    OR:    [x]  Exclude from SEP-1 because:    []  No infection present or suspected  [x]  Does not have 2+ SIRS criteria but may have an incidental infection that requires treatment  []  May have sepsis, but does not meet criteria for severe sepsis or septic shock  []  Alternative explanation for abnormal labs and/or vitals (see MDM)  []  Viral etiology found or highly suspected (including COVID-19) without concomitant bacterial infection   Must meet 1:    []  Lactate > 2       or   []  Signs of Organ Dysfunction:    - SBP < 90 or MAP < 65  - Altered mental status  - Creatinine > 2 or increased from      baseline  - Urine Output < 0.5 ml/kg/hr  - Bilirubin > 2  - INR > 1.5 (not anticoagulated)  - Platelets < 100,000  - Acute Respiratory Failure as     evidenced by new need for NIPPV     or mechanical ventilation        []  No criteria met for Severe Sepsis.   Must meet 1:    []  Lactate > 4        or   []  SBP < 90 or MAP < 65 for at        least two readings in the first        hour after fluid bolus        administration      []  Vasopressors initiated (if hypotension persists after fluid resuscitation)                []  No criteria met for Septic Shock.   Patient Vitals for the past 6 hrs:   BP Temp Pulse Resp SpO2 Weight Percent Weight  Change   08/10/23 0819 138/89 98.1 F (36.7 C) 92 18 97 % -- --   08/10/23 0821 -- -- -- -- -- 97.1 kg (214 lb) 0      No results for input(s): WBC, LACTA, CREATININE, BILITOT, INR, PLT in the last 72 hours.        ED Course as of 08/10/23 0925    Fri Aug 10, 2023   9160 Patient reported that he had penicillin as a child and had a slight rash.  He did have amoxicillin in 2011 and when asked he did not have any difficulties tolerate the medication he has never had anaphylaxis [SS]      ED Course User Index  [SS] Wynn Monita SAUNDERS, MD       --------------------------------------------- PAST HISTORY ---------------------------------------------  Past Medical History:  has a past medical history of Asthma, Bronchitis, and Pneumonia.    Past Surgical History:  has a past surgical history that includes Appendectomy and Wisdom tooth extraction.    Social History:  reports that he has been smoking cigars. He has never used smokeless tobacco. He reports that he does not drink alcohol and does not use drugs.    Family History: family history includes Diabetes in his brother, brother, and father; Hypertension in his brother, brother, brother, and father; Lung Cancer (age of onset: 40) in his father.     The patient's home medications have been reviewed.    Allergies: Penicillins    -------------------------------------------------- RESULTS -------------------------------------------------  Labs:  Results for orders placed or performed during the hospital encounter of 08/10/23   Urinalysis with Microscopic   Result Value Ref Range    Color, UA Yellow Yellow    Turbidity UA Clear Clear    Glucose, Ur NEGATIVE NEGATIVE mg/dL    Bilirubin, Urine NEGATIVE NEGATIVE    Ketones, Urine TRACE (A) NEGATIVE mg/dL    Specific Gravity, UA 1.025 1.005 - 1.030    Urine Hgb SMALL (A) NEGATIVE    pH, Urine 6.0 5.0 - 8.0    Protein, UA NEGATIVE NEGATIVE mg/dL    Urobilinogen, Urine 0.2 0.0 - 1.0 EU/dL    Nitrite, Urine NEGATIVE NEGATIVE    Leukocyte Esterase, Urine NEGATIVE NEGATIVE    WBC, UA PENDING /HPF    RBC, UA PENDING /HPF    Casts UA PENDING /LPF    Crystals, UA PENDING None /HPF    Epithelial Cells, UA PENDING /HPF    Renal Epithelial, UA PENDING 0 /HPF    Bacteria, UA PENDING  None    Mucus, UA PENDING None    Trichomonas PENDING None    Amorphous, UA PENDING None    Yeast, UA PENDING None    Other Observations UA PENDING NOT REQ.    Sperm, Urine PENDING None seen       Radiology:  No orders to display       ------------------------- NURSING NOTES AND VITALS REVIEWED ---------------------------  Date / Time Roomed:  08/10/2023  8:24 AM  ED Bed Assignment:  04/04    The nursing notes within the ED encounter and vital signs as below have been reviewed.   BP 138/89   Pulse 92   Temp 98.1 F (36.7 C) (Oral)   Resp 18   Wt 97.1 kg (214 lb)   SpO2 97%   BMI 29.85 kg/m   Oxygen Saturation Interpretation: Normal      ------------------------------------------ PROGRESS NOTES ------------------------------------------  9:25 AM EDT  I have spoken with the patient  and discussed today's results, in addition to providing specific details for the plan of care and counseling regarding the diagnosis and prognosis.  Their questions are answered at this time and they are agreeable with the plan. I discussed at length with them reasons for immediate return here for re evaluation. They will followup with their primary care physician by calling their office tomorrow.      --------------------------------- ADDITIONAL PROVIDER NOTES ---------------------------------  At this time the patient is without objective evidence of an acute process requiring hospitalization or inpatient management.  They have remained hemodynamically stable throughout their entire ED visit and are stable for discharge with outpatient follow-up.     The plan has been discussed in detail and they are aware of the specific conditions for emergent return, as well as the importance of follow-up.      New Prescriptions    DOXYCYCLINE  HYCLATE (VIBRA -TABS) 100 MG TABLET    Take 1 tablet by mouth 2 times daily for 10 days       Diagnosis:  1. Dysuria    2. Possible exposure to STD        Disposition:  Patient's disposition: Discharge to  home  Patient's condition is stable.       Wynn Monita SAUNDERS, MD  08/10/23 [phone]

## 2023-08-11 LAB — CULTURE, URINE

## 2023-08-14 LAB — C.TRACHOMATIS N.GONORRHOEAE DNA, URINE
C. trachomatis DNA ,Urine: NEGATIVE
N. gonorrhoeae DNA, Urine: NEGATIVE
